# Patient Record
Sex: Male | Born: 1940 | Race: White | Hispanic: No | Marital: Married | Smoking: Former smoker
Health system: Southern US, Community
[De-identification: ages and names within clinical notes are randomized; demographics above are authoritative.]

## PROBLEM LIST (undated history)

## (undated) DIAGNOSIS — N201 Calculus of ureter: Secondary | ICD-10-CM

## (undated) DIAGNOSIS — R351 Nocturia: Secondary | ICD-10-CM

## (undated) DIAGNOSIS — Z87442 Personal history of urinary calculi: Secondary | ICD-10-CM

## (undated) DIAGNOSIS — R350 Frequency of micturition: Secondary | ICD-10-CM

## (undated) DIAGNOSIS — I10 Essential (primary) hypertension: Secondary | ICD-10-CM

## (undated) HISTORY — PX: EXTRACORPOREAL SHOCK WAVE LITHOTRIPSY: SHX1557

---

## 1998-07-10 ENCOUNTER — Emergency Department (HOSPITAL_COMMUNITY): Admission: EM | Admit: 1998-07-10 | Discharge: 1998-07-10 | Payer: Self-pay | Admitting: Emergency Medicine

## 2000-08-09 ENCOUNTER — Emergency Department (HOSPITAL_COMMUNITY): Admission: EM | Admit: 2000-08-09 | Discharge: 2000-08-09 | Payer: Self-pay | Admitting: Emergency Medicine

## 2001-05-16 ENCOUNTER — Encounter: Payer: Self-pay | Admitting: Urology

## 2001-05-16 ENCOUNTER — Encounter: Admission: RE | Admit: 2001-05-16 | Discharge: 2001-05-16 | Payer: Self-pay | Admitting: Urology

## 2002-12-20 ENCOUNTER — Emergency Department (HOSPITAL_COMMUNITY): Admission: EM | Admit: 2002-12-20 | Discharge: 2002-12-20 | Payer: Self-pay

## 2004-07-13 ENCOUNTER — Encounter: Admission: RE | Admit: 2004-07-13 | Discharge: 2004-07-13 | Payer: Self-pay | Admitting: Internal Medicine

## 2005-09-29 IMAGING — CR DG RIBS 2V*R*
3 series · 3 of 3 positions shown · non-contrast
Comparison: none

CLINICAL DATA: Right rib pain. 
 RIGHT RIBS: 
 There is no evidence of fracture, focal lesions, or other significant bone abnormality.

[view not recorded (1 of 3)]
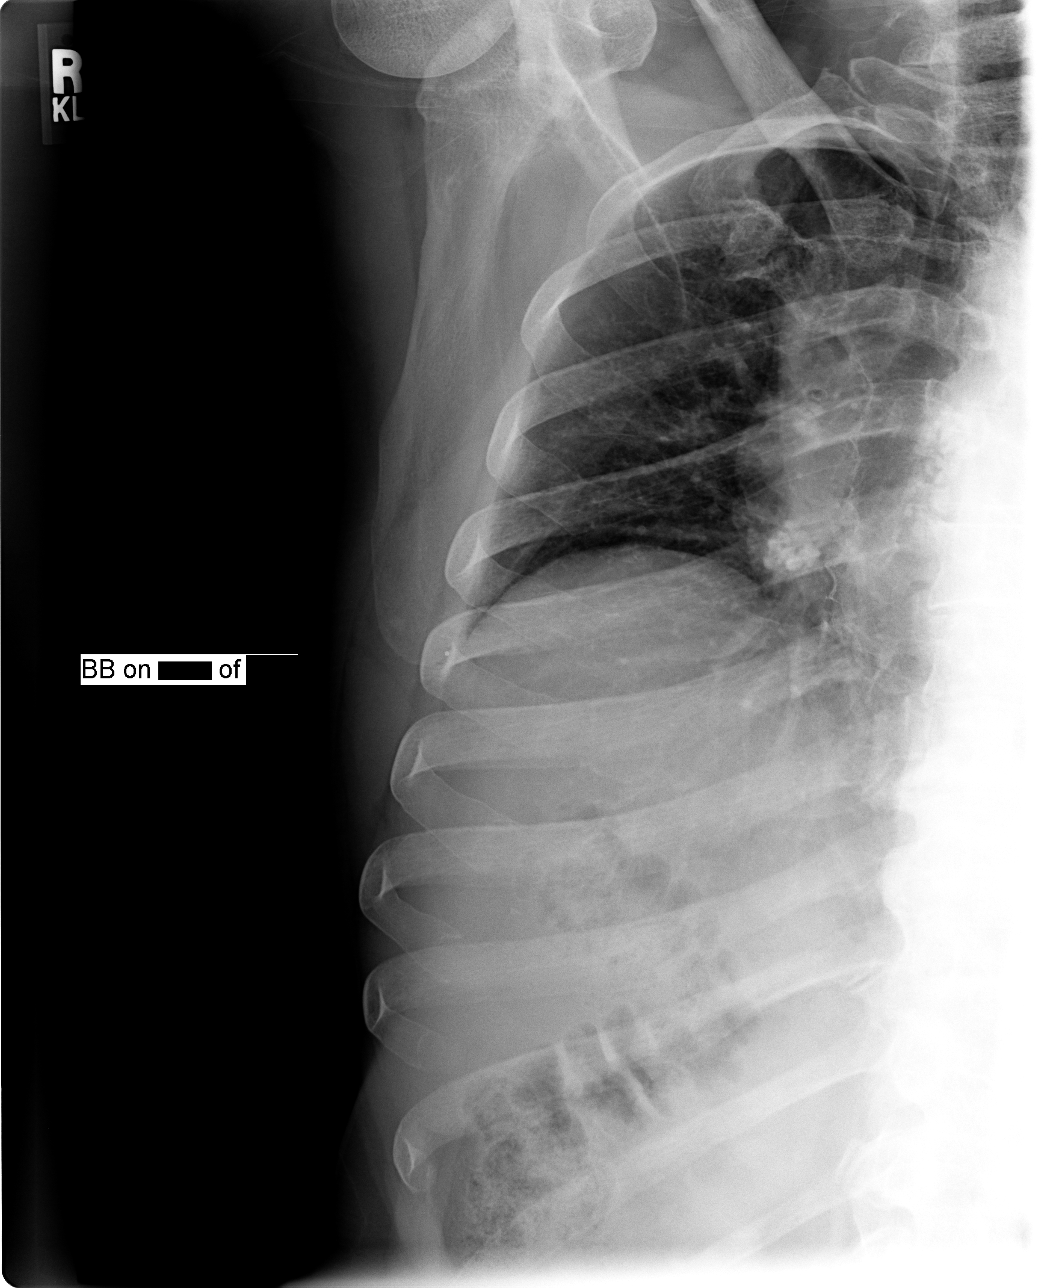

[view not recorded (2 of 3)]
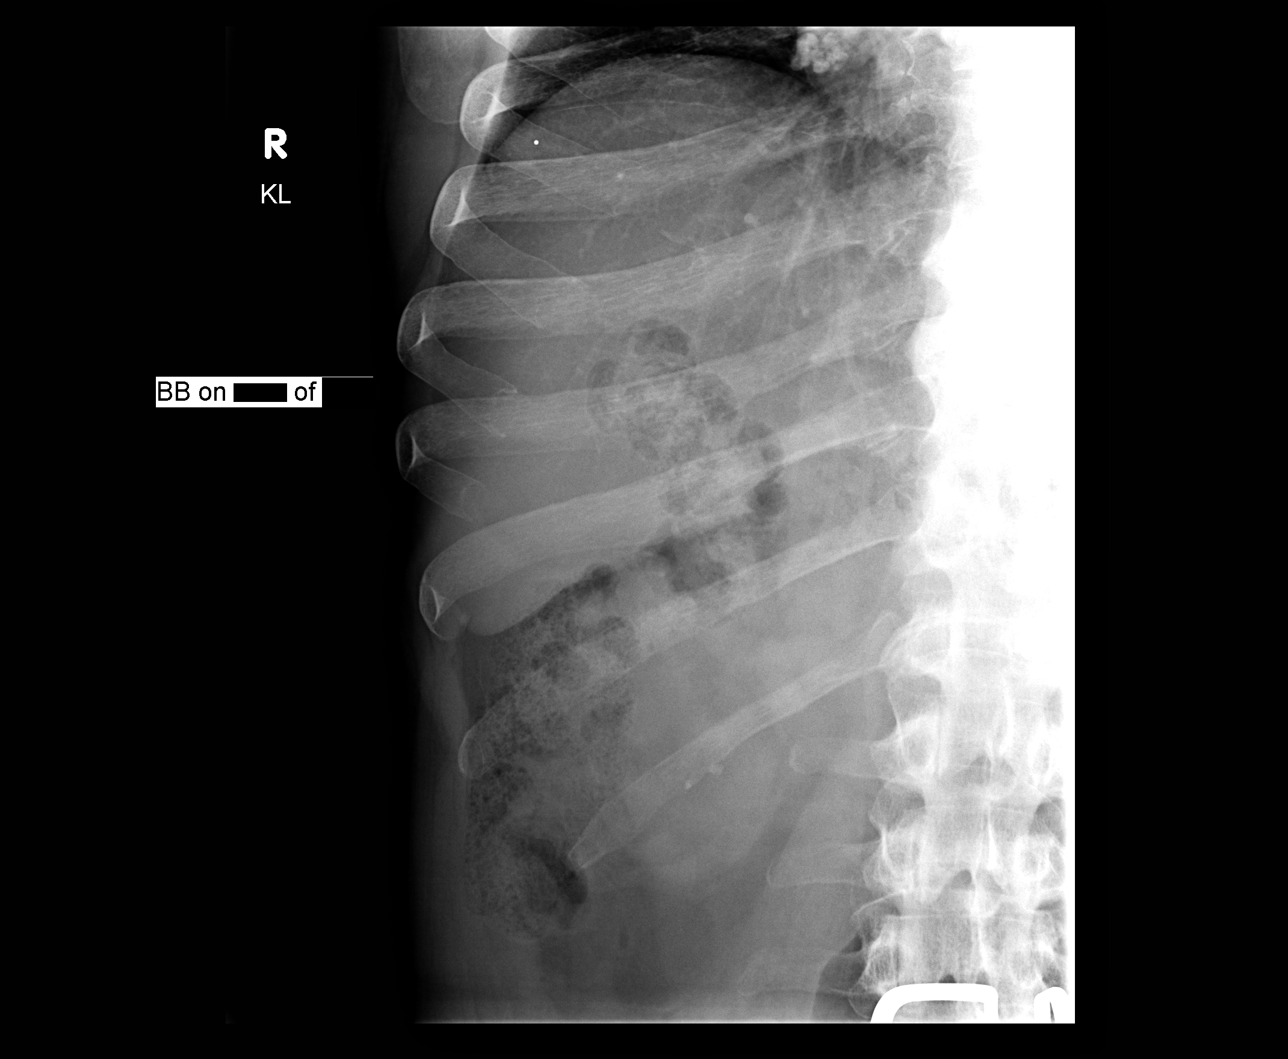

[view not recorded (3 of 3)]
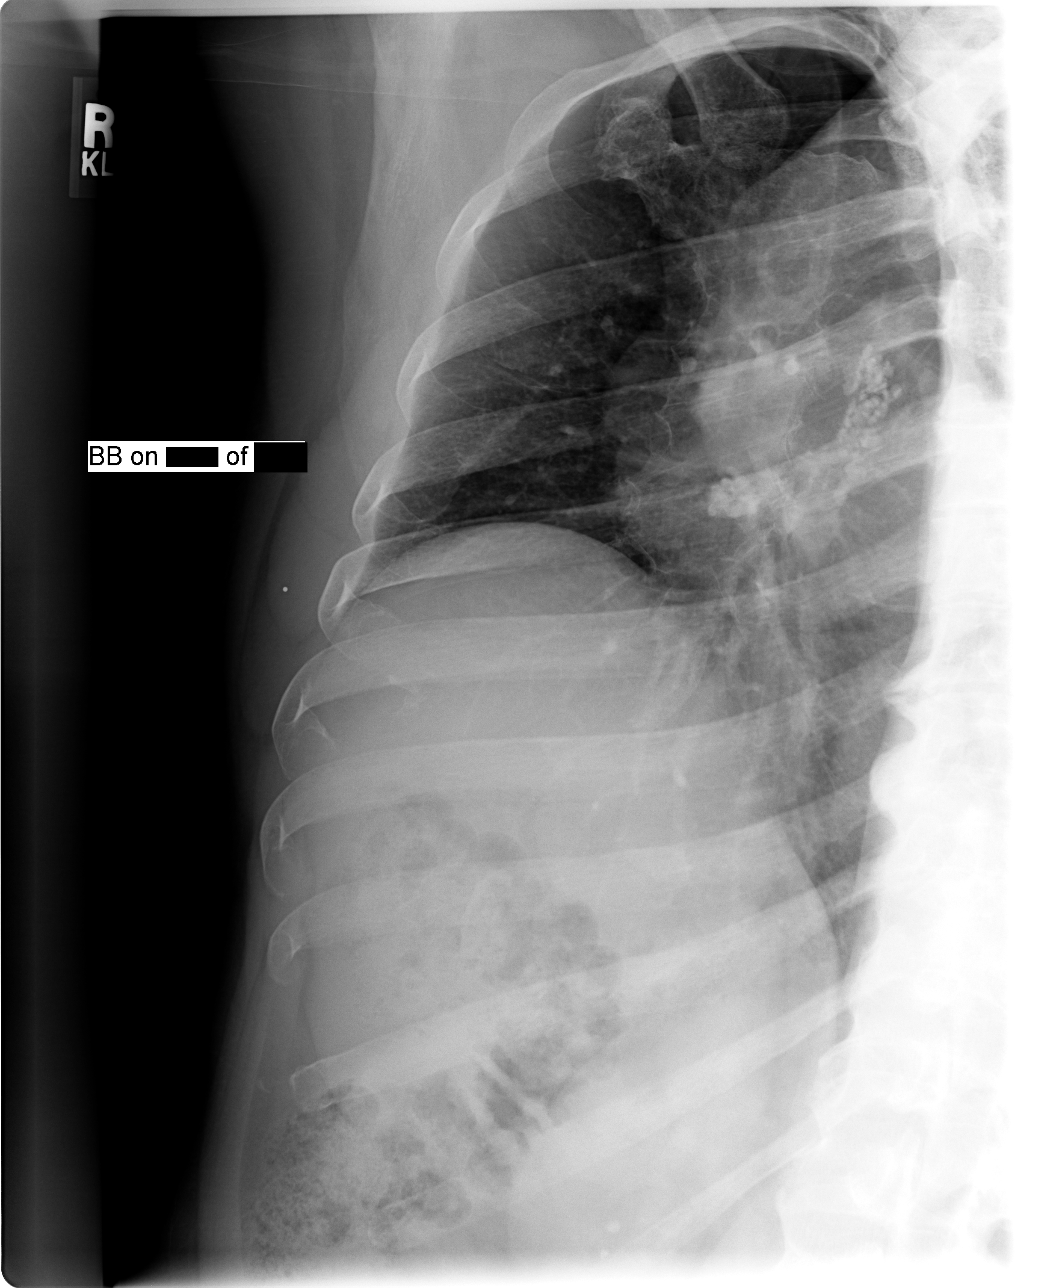

[3 of 3 positions shown; findings below may reference images not displayed]

IMPRESSION: Normal study. 
 TWO VIEW CHEST: 
 There are calcified right hilar and subcarinal lymph nodes consistent with previous granulomatous disease.  The lungs are free of infiltrates.  There is no pneumothorax.  The heart is normal in size.
IMPRESSION: Calcified right hilar and subcarinal lymph nodes consistent with previous granulomatous disease.  No evidence for active chest disease.

## 2009-03-23 ENCOUNTER — Encounter (INDEPENDENT_AMBULATORY_CARE_PROVIDER_SITE_OTHER): Payer: Self-pay | Admitting: *Deleted

## 2009-07-05 ENCOUNTER — Ambulatory Visit (HOSPITAL_BASED_OUTPATIENT_CLINIC_OR_DEPARTMENT_OTHER): Admission: RE | Admit: 2009-07-05 | Discharge: 2009-07-05 | Payer: Self-pay | Admitting: Urology

## 2009-07-07 HISTORY — PX: OTHER SURGICAL HISTORY: SHX169

## 2009-12-09 ENCOUNTER — Telehealth: Payer: Self-pay | Admitting: Internal Medicine

## 2010-11-24 NOTE — Progress Notes (Signed)
Summary: Schedule Colonoscopy  Phone Note Outgoing Call Call back at [facility] Phone [phone]   Call placed by: Harlow Mares CMA Duncan Dull),  December 09, 2009 4:27 PM Call placed to: Patient Summary of Call: Left message on patients machine to call back. patient needs colonoscopy Initial call taken by: Harlow Mares CMA Duncan Dull),  December 09, 2009 4:27 PM  Follow-up for Phone Call        Left message on patients machine to call back.  Follow-up by: Harlow Mares CMA Duncan Dull),  December 15, 2009 12:59 PM

## 2011-01-23 ENCOUNTER — Ambulatory Visit (HOSPITAL_COMMUNITY)
Admission: RE | Admit: 2011-01-23 | Discharge: 2011-01-23 | Disposition: A | Discharge status: Home / Self Care | Payer: Medicare Other | Source: Ambulatory Visit | Attending: Urology | Admitting: Urology

## 2011-01-23 DIAGNOSIS — I10 Essential (primary) hypertension: Secondary | ICD-10-CM | POA: Insufficient documentation

## 2011-01-23 DIAGNOSIS — N200 Calculus of kidney: Secondary | ICD-10-CM | POA: Insufficient documentation

## 2011-01-27 LAB — POCT I-STAT 4, (NA,K, GLUC, HGB,HCT)
Glucose, Bld: 101 mg/dL — ABNORMAL HIGH (ref 70–99)
HCT: 48 % (ref 39.0–52.0)
Hemoglobin: 16.3 g/dL (ref 13.0–17.0)
Potassium: 3.5 mEq/L (ref 3.5–5.1)
Sodium: 143 mEq/L (ref 135–145)

## 2011-12-18 DIAGNOSIS — H2510 Age-related nuclear cataract, unspecified eye: Secondary | ICD-10-CM | POA: Diagnosis not present

## 2012-02-16 DIAGNOSIS — N200 Calculus of kidney: Secondary | ICD-10-CM | POA: Diagnosis not present

## 2012-05-01 DIAGNOSIS — E785 Hyperlipidemia, unspecified: Secondary | ICD-10-CM | POA: Diagnosis not present

## 2012-05-01 DIAGNOSIS — R7301 Impaired fasting glucose: Secondary | ICD-10-CM | POA: Diagnosis not present

## 2012-05-01 DIAGNOSIS — Z125 Encounter for screening for malignant neoplasm of prostate: Secondary | ICD-10-CM | POA: Diagnosis not present

## 2012-05-01 DIAGNOSIS — I10 Essential (primary) hypertension: Secondary | ICD-10-CM | POA: Diagnosis not present

## 2012-05-06 ENCOUNTER — Encounter: Payer: Self-pay | Admitting: Internal Medicine

## 2012-05-09 DIAGNOSIS — Z0000 Encounter for general adult medical examination without abnormal findings: Secondary | ICD-10-CM | POA: Diagnosis not present

## 2012-05-09 DIAGNOSIS — I10 Essential (primary) hypertension: Secondary | ICD-10-CM | POA: Diagnosis not present

## 2012-05-09 DIAGNOSIS — R7301 Impaired fasting glucose: Secondary | ICD-10-CM | POA: Diagnosis not present

## 2012-05-09 DIAGNOSIS — N200 Calculus of kidney: Secondary | ICD-10-CM | POA: Diagnosis not present

## 2012-05-14 DIAGNOSIS — Z1212 Encounter for screening for malignant neoplasm of rectum: Secondary | ICD-10-CM | POA: Diagnosis not present

## 2012-12-18 DIAGNOSIS — H2510 Age-related nuclear cataract, unspecified eye: Secondary | ICD-10-CM | POA: Diagnosis not present

## 2013-02-18 DIAGNOSIS — H6120 Impacted cerumen, unspecified ear: Secondary | ICD-10-CM | POA: Diagnosis not present

## 2013-04-02 DIAGNOSIS — M720 Palmar fascial fibromatosis [Dupuytren]: Secondary | ICD-10-CM | POA: Diagnosis not present

## 2013-04-02 DIAGNOSIS — M6530 Trigger finger, unspecified finger: Secondary | ICD-10-CM | POA: Diagnosis not present

## 2013-05-07 DIAGNOSIS — R7301 Impaired fasting glucose: Secondary | ICD-10-CM | POA: Diagnosis not present

## 2013-05-07 DIAGNOSIS — I10 Essential (primary) hypertension: Secondary | ICD-10-CM | POA: Diagnosis not present

## 2013-05-07 DIAGNOSIS — Z125 Encounter for screening for malignant neoplasm of prostate: Secondary | ICD-10-CM | POA: Diagnosis not present

## 2013-05-07 DIAGNOSIS — E785 Hyperlipidemia, unspecified: Secondary | ICD-10-CM | POA: Diagnosis not present

## 2013-05-14 DIAGNOSIS — Z1331 Encounter for screening for depression: Secondary | ICD-10-CM | POA: Diagnosis not present

## 2013-05-14 DIAGNOSIS — I10 Essential (primary) hypertension: Secondary | ICD-10-CM | POA: Diagnosis not present

## 2013-05-14 DIAGNOSIS — Z125 Encounter for screening for malignant neoplasm of prostate: Secondary | ICD-10-CM | POA: Diagnosis not present

## 2013-05-14 DIAGNOSIS — Z6827 Body mass index (BMI) 27.0-27.9, adult: Secondary | ICD-10-CM | POA: Diagnosis not present

## 2013-05-14 DIAGNOSIS — E785 Hyperlipidemia, unspecified: Secondary | ICD-10-CM | POA: Diagnosis not present

## 2013-05-14 DIAGNOSIS — R7301 Impaired fasting glucose: Secondary | ICD-10-CM | POA: Diagnosis not present

## 2013-05-14 DIAGNOSIS — N200 Calculus of kidney: Secondary | ICD-10-CM | POA: Diagnosis not present

## 2013-05-14 DIAGNOSIS — Z0000 Encounter for general adult medical examination without abnormal findings: Secondary | ICD-10-CM | POA: Diagnosis not present

## 2013-05-15 DIAGNOSIS — Z1212 Encounter for screening for malignant neoplasm of rectum: Secondary | ICD-10-CM | POA: Diagnosis not present

## 2013-08-05 DIAGNOSIS — N200 Calculus of kidney: Secondary | ICD-10-CM | POA: Diagnosis not present

## 2013-10-31 DIAGNOSIS — M6530 Trigger finger, unspecified finger: Secondary | ICD-10-CM | POA: Diagnosis not present

## 2013-11-25 ENCOUNTER — Other Ambulatory Visit: Payer: Self-pay | Admitting: Orthopedic Surgery

## 2013-11-27 DIAGNOSIS — H6120 Impacted cerumen, unspecified ear: Secondary | ICD-10-CM | POA: Diagnosis not present

## 2013-12-01 NOTE — H&P (Signed)
  Marcus Morgan is an 73 y.o. male.   Chief Complaint: c/o chronic STS right ring finger HPI: Marcus Morgan presents for evaluation of a recurrent right ring STS. About 5 years ago he consulted for an early STS and responded to steroid injection. Marcus Morgan retired 2 years ago from [employer]. They are a firm that has moved to [place] and bought out the [employer] in [place].     No past medical history on file.  No past surgical history on file.  No family history on file. Social History:  has no tobacco, alcohol, and drug history on file.  Allergies: Allergies not on file  No prescriptions prior to admission    No results found for this or any previous visit (from the past 48 hour(s)).  No results found.   Pertinent items are noted in HPI.  There were no vitals taken for this visit.  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 He can no longer hyperextend the ring finger MP joint. He has full further flexion but locks his ring finger in flexion. He does not have a PIP flexion contracture of the right ring finger. His pulses and capillary refill are intact. His motor and sensory exam is intact.   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  DASNOIT,Mialani Reicks J 12/01/2013, 4:16 PM   H&P documentation: 12/02/2013  -History and Physical Reviewed  -Patient has been re-examined  -No change in the plan of care  Marcus Sickle, MD

## 2013-12-02 ENCOUNTER — Encounter (HOSPITAL_BASED_OUTPATIENT_CLINIC_OR_DEPARTMENT_OTHER): Payer: Self-pay

## 2013-12-02 ENCOUNTER — Ambulatory Visit (HOSPITAL_BASED_OUTPATIENT_CLINIC_OR_DEPARTMENT_OTHER)
Admission: RE | Admit: 2013-12-02 | Discharge: 2013-12-02 | Disposition: A | Discharge status: Home / Self Care | Payer: Medicare Other | Source: Ambulatory Visit | Attending: Orthopedic Surgery | Admitting: Orthopedic Surgery

## 2013-12-02 ENCOUNTER — Encounter (HOSPITAL_BASED_OUTPATIENT_CLINIC_OR_DEPARTMENT_OTHER)
Admission: RE | Disposition: A | Discharge status: Home / Self Care | Payer: Self-pay | Source: Ambulatory Visit | Attending: Orthopedic Surgery

## 2013-12-02 DIAGNOSIS — M720 Palmar fascial fibromatosis [Dupuytren]: Secondary | ICD-10-CM | POA: Diagnosis not present

## 2013-12-02 DIAGNOSIS — M6530 Trigger finger, unspecified finger: Secondary | ICD-10-CM | POA: Diagnosis not present

## 2013-12-02 DIAGNOSIS — M65849 Other synovitis and tenosynovitis, unspecified hand: Principal | ICD-10-CM

## 2013-12-02 DIAGNOSIS — M65839 Other synovitis and tenosynovitis, unspecified forearm: Secondary | ICD-10-CM | POA: Diagnosis not present

## 2013-12-02 HISTORY — DX: Essential (primary) hypertension: I10

## 2013-12-02 HISTORY — PX: TRIGGER FINGER RELEASE: SHX641

## 2013-12-02 SURGERY — MINOR RELEASE TRIGGER FINGER/A-1 PULLEY
Anesthesia: LOCAL | Site: Finger | Laterality: Right

## 2013-12-02 MED ORDER — TRAMADOL HCL 50 MG PO TABS: 50.0000 mg | ORAL_TABLET | Freq: Four times a day (QID) | ORAL | Status: DC | PRN

## 2013-12-02 MED ORDER — LIDOCAINE HCL 2 % IJ SOLN
INTRAMUSCULAR | Status: DC | PRN
  Administered 2013-12-02: 4 mL

## 2013-12-02 MED ORDER — CHLORHEXIDINE GLUCONATE 4 % EX LIQD: 60.0000 mL | Freq: Once | CUTANEOUS | Status: DC

## 2013-12-02 SURGICAL SUPPLY — 39 items
BANDAGE ADH SHEER 1  50/CT (GAUZE/BANDAGES/DRESSINGS) IMPLANT
BANDAGE COBAN STERILE 2 (GAUZE/BANDAGES/DRESSINGS) ×2 IMPLANT
BLADE SURG 15 STRL LF DISP TIS (BLADE) ×1 IMPLANT
BLADE SURG 15 STRL SS (BLADE) ×1
BNDG ESMARK 4X9 LF (GAUZE/BANDAGES/DRESSINGS) IMPLANT
BRUSH SCRUB EZ PLAIN DRY (MISCELLANEOUS) ×2 IMPLANT
CORDS BIPOLAR (ELECTRODE) IMPLANT
COVER MAYO STAND STRL (DRAPES) ×2 IMPLANT
COVER TABLE BACK 60X90 (DRAPES) IMPLANT
CUFF TOURNIQUET SINGLE 18IN (TOURNIQUET CUFF) ×2 IMPLANT
DECANTER SPIKE VIAL GLASS SM (MISCELLANEOUS) IMPLANT
DRAPE SURG 17X23 STRL (DRAPES) ×2 IMPLANT
GLOVE BIOGEL M STRL SZ7.5 (GLOVE) IMPLANT
GLOVE BIOGEL PI IND STRL 7.0 (GLOVE) ×2 IMPLANT
GLOVE BIOGEL PI IND STRL 8 (GLOVE) ×1 IMPLANT
GLOVE BIOGEL PI INDICATOR 7.0 (GLOVE) ×2
GLOVE BIOGEL PI INDICATOR 8 (GLOVE) ×1
GLOVE ECLIPSE 6.5 STRL STRAW (GLOVE) ×2 IMPLANT
GLOVE ORTHO TXT STRL SZ7.5 (GLOVE) ×2 IMPLANT
GLOVE SURG SS PI 8.0 STRL IVOR (GLOVE) ×2 IMPLANT
GOWN STRL REUS W/ TWL LRG LVL3 (GOWN DISPOSABLE) ×1 IMPLANT
GOWN STRL REUS W/ TWL XL LVL3 (GOWN DISPOSABLE) ×2 IMPLANT
GOWN STRL REUS W/TWL LRG LVL3 (GOWN DISPOSABLE) ×1
GOWN STRL REUS W/TWL XL LVL3 (GOWN DISPOSABLE) ×2
NEEDLE 27GAX1X1/2 (NEEDLE) ×2 IMPLANT
PACK BASIN DAY SURGERY FS (CUSTOM PROCEDURE TRAY) ×2 IMPLANT
PADDING CAST ABS 4INX4YD NS (CAST SUPPLIES) ×1
PADDING CAST ABS COTTON 4X4 ST (CAST SUPPLIES) ×1 IMPLANT
SPONGE GAUZE 4X4 12PLY (GAUZE/BANDAGES/DRESSINGS) ×2 IMPLANT
SPONGE GAUZE 4X4 12PLY STER LF (GAUZE/BANDAGES/DRESSINGS) IMPLANT
STOCKINETTE 4X48 STRL (DRAPES) ×2 IMPLANT
STRIP CLOSURE SKIN 1/2X4 (GAUZE/BANDAGES/DRESSINGS) ×2 IMPLANT
SUT PROLENE 3 0 PS 2 (SUTURE) ×2 IMPLANT
SUT PROLENE 4 0 P 3 18 (SUTURE) IMPLANT
SYR 3ML 23GX1 SAFETY (SYRINGE) IMPLANT
SYR CONTROL 10ML LL (SYRINGE) ×2 IMPLANT
TOWEL OR 17X24 6PK STRL BLUE (TOWEL DISPOSABLE) ×4 IMPLANT
TRAY DSU PREP LF (CUSTOM PROCEDURE TRAY) ×2 IMPLANT
UNDERPAD 30X30 INCONTINENT (UNDERPADS AND DIAPERS) ×2 IMPLANT

## 2013-12-02 NOTE — Discharge Instructions (Signed)

## 2013-12-02 NOTE — Brief Op Note (Signed)
12/02/2013  12:17 PM  PATIENT:  Marcus Morgan  73 y.o. male  PRE-OPERATIVE DIAGNOSIS:  STENOSING TENOSYNOVITIS RIGHT RING FINGER  POST-OPERATIVE DIAGNOSIS:  STENOSING TENOSYNOVITIS RIGHT RING FINGER  PROCEDURE:  Procedure(s): MINOR RELEASE A-1 PULLEY RIGHT RING FINGER AND INCIDENTAL DEPUYTREN'S EXCISION (Right)  SURGEON:  Surgeon(s) and Role:    * Cammie Sickle., MD - Primary  PHYSICIAN ASSISTANT:   ASSISTANTS: nurse  ANESTHESIA:   local  EBL:     BLOOD ADMINISTERED:none  DRAINS: none   LOCAL MEDICATIONS USED:  XYLOCAINE   SPECIMEN:  No Specimen  DISPOSITION OF SPECIMEN:  N/A  COUNTS:  YES  TOURNIQUET:   Total Tourniquet Time Documented: Upper Arm (Right) - 9 minutes Total: Upper Arm (Right) - 9 minutes   DICTATION: .Other Dictation: Dictation Number [id number]  PLAN OF CARE: Discharge to home after PACU  PATIENT DISPOSITION:  PACU - hemodynamically stable.   Delay start of Pharmacological VTE agent (>24hrs) due to surgical blood loss or risk of bleeding: not applicable

## 2013-12-02 NOTE — Op Note (Signed)
[id number] 

## 2013-12-03 NOTE — Op Note (Signed)
Marcus Morgan, Marcus NO.:  [id number]  Marcus RECORD NO.:  [id number]  LOCATION:                                 FACILITY:  PHYSICIAN:  Marcus Morgan, M.D. 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  OPERATION: 1. Limited resection of palmar fascia. 2. Release of right ring finger A1 pulley.  OPERATING SURGEON:  Marcus Mighty. Raylan Troiani, MD  ASSISTANT:  Nurse.  ANESTHESIA:  2% lidocaine, this was performed as a local anesthesia minor operating room procedure.  INDICATIONS:  Marcus Morgan is a 73 year old gentleman referred through the courtesy of Dr. Domenick Morgan, for management of chronic trigger finger.  Clinical examination revealed Dupuytren's palmar fibromatosis, which can aggravate trigger finger onset.  Marcus Morgan was advised to undergo limited resection of his palmar fascia and release of the A1 pulley of the ring finger.  Surgeon aftercare described in detail.  He understands Dupuytren's palmar fibromatosis predicament will progress over time.  This is genetically driven pathology that will appear in other areas in his hand and may require further intervention at a later date.  In the holding area, Marcus Morgan was carefully examined.  He was beginning to develop early triggering of his long finger.  We will address this with a steroid injection at the office.  After detailed informed consent, he was brought to the operating at this time as a minor operating room procedure.  PROCEDURE IN DETAIL:  Marcus Morgan was brought  to room 2 of the Marcus Morgan, placed in supine position on the operating table.  Following detailed informed consent, Betadine prep 4 mL of 2% lidocaine were infiltrated at metacarpal head level to obtain a digital block and into the flexor sheath of the right ring finger. The hand and arm were then prepped with Betadine soap and solution, sterilely draped.  A pneumatic tourniquet was applied to the proximal brachium.  Due to mild systolic hypertension, the tourniquet was set at 250 mmHg.  Procedure commenced with routine surgical time-out. Following exsanguination of the arm by direct compression, the arterial tourniquet was inflated to 250 mmHg.  After testing for adequate anesthesia, we performed a 2-cm oblique incision directly over the A1 pulley and palmar fascia.  The fatty dermis was undermined separating the pathologic fascia from the deep side of the dermis followed by resection of 2.5 cm length of the pretendinous fibers to the ring finger.  The flexor sheath was identified.  There was a band of fascia proximal to the A1 pulley that was released.  We then identified the A1 pulley, placed Ragnell retractors to protect the neurovascular structures and release the A1 pulley, it's full length.  Thereafter, Marcus Morgan demonstrated full active range of motion of his finger in flexion and extension.  The wound was inspected for bleeding  points and repaired with intradermal 4-0 Prolene suture.  Steri-Strips applied.  The tourniquet was released.  Pressure was held for 2 minutes due to the fact that he is on aspirin.  We then placed a compressive dressing with sterile gauze and Ace wrap.  There were no apparent complications.  For aftercare, he was provided a prescription for tramadol 50 mg 1-2 tablets p.o. q.4-6 hours p.r.n. pain, 20 tabs without refill.     Marcus Mighty Marcus Morgan, M.D.     RVS/MEDQ  D:  12/02/2013  T:  12/03/2013  Job:  [id number]  cc:   Marcus Morgan, M.D.

## 2013-12-04 ENCOUNTER — Encounter (HOSPITAL_BASED_OUTPATIENT_CLINIC_OR_DEPARTMENT_OTHER): Payer: Self-pay | Admitting: Orthopedic Surgery

## 2014-01-08 DIAGNOSIS — H2510 Age-related nuclear cataract, unspecified eye: Secondary | ICD-10-CM | POA: Diagnosis not present

## 2014-01-08 DIAGNOSIS — H40049 Steroid responder, unspecified eye: Secondary | ICD-10-CM | POA: Diagnosis not present

## 2014-02-18 DIAGNOSIS — H43819 Vitreous degeneration, unspecified eye: Secondary | ICD-10-CM | POA: Diagnosis not present

## 2014-02-26 DIAGNOSIS — H43399 Other vitreous opacities, unspecified eye: Secondary | ICD-10-CM | POA: Diagnosis not present

## 2014-03-23 DIAGNOSIS — H43399 Other vitreous opacities, unspecified eye: Secondary | ICD-10-CM | POA: Diagnosis not present

## 2014-04-06 DIAGNOSIS — S20219A Contusion of unspecified front wall of thorax, initial encounter: Secondary | ICD-10-CM | POA: Diagnosis not present

## 2014-04-06 DIAGNOSIS — M546 Pain in thoracic spine: Secondary | ICD-10-CM | POA: Diagnosis not present

## 2014-04-06 DIAGNOSIS — Y92009 Unspecified place in unspecified non-institutional (private) residence as the place of occurrence of the external cause: Secondary | ICD-10-CM | POA: Diagnosis not present

## 2014-04-15 DIAGNOSIS — M549 Dorsalgia, unspecified: Secondary | ICD-10-CM | POA: Diagnosis not present

## 2014-04-15 DIAGNOSIS — S2341XA Sprain of ribs, initial encounter: Secondary | ICD-10-CM | POA: Diagnosis not present

## 2014-04-15 DIAGNOSIS — Y92009 Unspecified place in unspecified non-institutional (private) residence as the place of occurrence of the external cause: Secondary | ICD-10-CM | POA: Diagnosis not present

## 2014-04-20 DIAGNOSIS — H2510 Age-related nuclear cataract, unspecified eye: Secondary | ICD-10-CM | POA: Diagnosis not present

## 2014-06-03 DIAGNOSIS — I10 Essential (primary) hypertension: Secondary | ICD-10-CM | POA: Diagnosis not present

## 2014-06-03 DIAGNOSIS — E785 Hyperlipidemia, unspecified: Secondary | ICD-10-CM | POA: Diagnosis not present

## 2014-06-03 DIAGNOSIS — Z125 Encounter for screening for malignant neoplasm of prostate: Secondary | ICD-10-CM | POA: Diagnosis not present

## 2014-06-03 DIAGNOSIS — R7301 Impaired fasting glucose: Secondary | ICD-10-CM | POA: Diagnosis not present

## 2014-06-10 ENCOUNTER — Encounter: Payer: Self-pay | Admitting: Internal Medicine

## 2014-06-10 DIAGNOSIS — Z0000 Encounter for general adult medical examination without abnormal findings: Secondary | ICD-10-CM | POA: Diagnosis not present

## 2014-06-10 DIAGNOSIS — R7301 Impaired fasting glucose: Secondary | ICD-10-CM | POA: Diagnosis not present

## 2014-06-10 DIAGNOSIS — E785 Hyperlipidemia, unspecified: Secondary | ICD-10-CM | POA: Diagnosis not present

## 2014-06-10 DIAGNOSIS — Z125 Encounter for screening for malignant neoplasm of prostate: Secondary | ICD-10-CM | POA: Diagnosis not present

## 2014-06-10 DIAGNOSIS — Z6827 Body mass index (BMI) 27.0-27.9, adult: Secondary | ICD-10-CM | POA: Diagnosis not present

## 2014-06-10 DIAGNOSIS — Z1331 Encounter for screening for depression: Secondary | ICD-10-CM | POA: Diagnosis not present

## 2014-06-10 DIAGNOSIS — N200 Calculus of kidney: Secondary | ICD-10-CM | POA: Diagnosis not present

## 2014-06-10 DIAGNOSIS — I10 Essential (primary) hypertension: Secondary | ICD-10-CM | POA: Diagnosis not present

## 2014-06-11 DIAGNOSIS — Z1212 Encounter for screening for malignant neoplasm of rectum: Secondary | ICD-10-CM | POA: Diagnosis not present

## 2014-08-03 DIAGNOSIS — H10011 Acute follicular conjunctivitis, right eye: Secondary | ICD-10-CM | POA: Diagnosis not present

## 2014-08-10 DIAGNOSIS — N200 Calculus of kidney: Secondary | ICD-10-CM | POA: Diagnosis not present

## 2014-08-18 ENCOUNTER — Encounter: Payer: Medicare Other | Admitting: Internal Medicine

## 2015-06-07 DIAGNOSIS — R7302 Impaired glucose tolerance (oral): Secondary | ICD-10-CM | POA: Diagnosis not present

## 2015-06-07 DIAGNOSIS — I10 Essential (primary) hypertension: Secondary | ICD-10-CM | POA: Diagnosis not present

## 2015-06-07 DIAGNOSIS — Z125 Encounter for screening for malignant neoplasm of prostate: Secondary | ICD-10-CM | POA: Diagnosis not present

## 2015-06-07 DIAGNOSIS — E785 Hyperlipidemia, unspecified: Secondary | ICD-10-CM | POA: Diagnosis not present

## 2015-06-14 DIAGNOSIS — R7302 Impaired glucose tolerance (oral): Secondary | ICD-10-CM | POA: Diagnosis not present

## 2015-06-14 DIAGNOSIS — I10 Essential (primary) hypertension: Secondary | ICD-10-CM | POA: Diagnosis not present

## 2015-06-14 DIAGNOSIS — Z1389 Encounter for screening for other disorder: Secondary | ICD-10-CM | POA: Diagnosis not present

## 2015-06-14 DIAGNOSIS — Z6827 Body mass index (BMI) 27.0-27.9, adult: Secondary | ICD-10-CM | POA: Diagnosis not present

## 2015-06-14 DIAGNOSIS — M720 Palmar fascial fibromatosis [Dupuytren]: Secondary | ICD-10-CM | POA: Diagnosis not present

## 2015-06-14 DIAGNOSIS — M65332 Trigger finger, left middle finger: Secondary | ICD-10-CM | POA: Diagnosis not present

## 2015-06-14 DIAGNOSIS — N200 Calculus of kidney: Secondary | ICD-10-CM | POA: Diagnosis not present

## 2015-06-14 DIAGNOSIS — Z0000 Encounter for general adult medical examination without abnormal findings: Secondary | ICD-10-CM | POA: Diagnosis not present

## 2015-06-14 DIAGNOSIS — E785 Hyperlipidemia, unspecified: Secondary | ICD-10-CM | POA: Diagnosis not present

## 2015-06-21 DIAGNOSIS — Z1212 Encounter for screening for malignant neoplasm of rectum: Secondary | ICD-10-CM | POA: Diagnosis not present

## 2015-07-12 DIAGNOSIS — M720 Palmar fascial fibromatosis [Dupuytren]: Secondary | ICD-10-CM | POA: Diagnosis not present

## 2015-07-12 DIAGNOSIS — M65332 Trigger finger, left middle finger: Secondary | ICD-10-CM | POA: Diagnosis not present

## 2015-07-19 ENCOUNTER — Encounter: Payer: Self-pay | Admitting: Internal Medicine

## 2015-08-09 DIAGNOSIS — R351 Nocturia: Secondary | ICD-10-CM | POA: Diagnosis not present

## 2015-08-09 DIAGNOSIS — N401 Enlarged prostate with lower urinary tract symptoms: Secondary | ICD-10-CM | POA: Diagnosis not present

## 2015-08-09 DIAGNOSIS — N138 Other obstructive and reflux uropathy: Secondary | ICD-10-CM | POA: Diagnosis not present

## 2015-08-09 DIAGNOSIS — N200 Calculus of kidney: Secondary | ICD-10-CM | POA: Diagnosis not present

## 2015-08-12 DIAGNOSIS — H2513 Age-related nuclear cataract, bilateral: Secondary | ICD-10-CM | POA: Diagnosis not present

## 2015-08-12 DIAGNOSIS — H524 Presbyopia: Secondary | ICD-10-CM | POA: Diagnosis not present

## 2015-10-06 ENCOUNTER — Encounter: Payer: Self-pay | Admitting: Internal Medicine

## 2015-10-08 DIAGNOSIS — M65332 Trigger finger, left middle finger: Secondary | ICD-10-CM | POA: Diagnosis not present

## 2015-10-11 DIAGNOSIS — Z6828 Body mass index (BMI) 28.0-28.9, adult: Secondary | ICD-10-CM | POA: Diagnosis not present

## 2015-10-11 DIAGNOSIS — E663 Overweight: Secondary | ICD-10-CM | POA: Diagnosis not present

## 2015-10-11 DIAGNOSIS — I10 Essential (primary) hypertension: Secondary | ICD-10-CM | POA: Diagnosis not present

## 2015-10-11 DIAGNOSIS — E785 Hyperlipidemia, unspecified: Secondary | ICD-10-CM | POA: Diagnosis not present

## 2015-10-13 DIAGNOSIS — D1801 Hemangioma of skin and subcutaneous tissue: Secondary | ICD-10-CM | POA: Diagnosis not present

## 2015-10-13 DIAGNOSIS — L821 Other seborrheic keratosis: Secondary | ICD-10-CM | POA: Diagnosis not present

## 2015-10-13 DIAGNOSIS — D225 Melanocytic nevi of trunk: Secondary | ICD-10-CM | POA: Diagnosis not present

## 2015-10-13 DIAGNOSIS — L820 Inflamed seborrheic keratosis: Secondary | ICD-10-CM | POA: Diagnosis not present

## 2015-12-08 DIAGNOSIS — M65331 Trigger finger, right middle finger: Secondary | ICD-10-CM | POA: Diagnosis not present

## 2016-01-13 DIAGNOSIS — H10501 Unspecified blepharoconjunctivitis, right eye: Secondary | ICD-10-CM | POA: Diagnosis not present

## 2016-06-15 DIAGNOSIS — H5213 Myopia, bilateral: Secondary | ICD-10-CM | POA: Diagnosis not present

## 2016-06-15 DIAGNOSIS — H524 Presbyopia: Secondary | ICD-10-CM | POA: Diagnosis not present

## 2016-06-15 DIAGNOSIS — H25813 Combined forms of age-related cataract, bilateral: Secondary | ICD-10-CM | POA: Diagnosis not present

## 2016-06-27 DIAGNOSIS — N200 Calculus of kidney: Secondary | ICD-10-CM | POA: Diagnosis not present

## 2016-06-27 DIAGNOSIS — I10 Essential (primary) hypertension: Secondary | ICD-10-CM | POA: Diagnosis not present

## 2016-07-10 DIAGNOSIS — R319 Hematuria, unspecified: Secondary | ICD-10-CM | POA: Diagnosis not present

## 2016-07-10 DIAGNOSIS — N390 Urinary tract infection, site not specified: Secondary | ICD-10-CM | POA: Diagnosis not present

## 2016-07-10 DIAGNOSIS — R399 Unspecified symptoms and signs involving the genitourinary system: Secondary | ICD-10-CM | POA: Diagnosis not present

## 2016-07-25 DIAGNOSIS — N401 Enlarged prostate with lower urinary tract symptoms: Secondary | ICD-10-CM | POA: Diagnosis not present

## 2016-07-25 DIAGNOSIS — R351 Nocturia: Secondary | ICD-10-CM | POA: Diagnosis not present

## 2016-07-28 DIAGNOSIS — Z23 Encounter for immunization: Secondary | ICD-10-CM | POA: Diagnosis not present

## 2016-08-04 DIAGNOSIS — R3915 Urgency of urination: Secondary | ICD-10-CM | POA: Diagnosis not present

## 2016-08-04 DIAGNOSIS — N132 Hydronephrosis with renal and ureteral calculous obstruction: Secondary | ICD-10-CM | POA: Diagnosis not present

## 2016-08-04 DIAGNOSIS — N200 Calculus of kidney: Secondary | ICD-10-CM | POA: Diagnosis not present

## 2016-08-04 DIAGNOSIS — R310 Gross hematuria: Secondary | ICD-10-CM | POA: Diagnosis not present

## 2016-08-04 DIAGNOSIS — R351 Nocturia: Secondary | ICD-10-CM | POA: Diagnosis not present

## 2016-08-14 DIAGNOSIS — N401 Enlarged prostate with lower urinary tract symptoms: Secondary | ICD-10-CM | POA: Diagnosis not present

## 2016-08-14 DIAGNOSIS — R351 Nocturia: Secondary | ICD-10-CM | POA: Diagnosis not present

## 2016-08-14 DIAGNOSIS — N201 Calculus of ureter: Secondary | ICD-10-CM | POA: Diagnosis not present

## 2016-08-16 ENCOUNTER — Other Ambulatory Visit: Payer: Self-pay | Admitting: Urology

## 2016-08-18 ENCOUNTER — Encounter (HOSPITAL_BASED_OUTPATIENT_CLINIC_OR_DEPARTMENT_OTHER): Payer: Self-pay | Admitting: *Deleted

## 2016-08-18 NOTE — Progress Notes (Signed)
NPO AFTER MN.  ARRIVE AT RN:[id number].  NEEDS ISTAT AND EKG.

## 2016-08-23 ENCOUNTER — Encounter (HOSPITAL_BASED_OUTPATIENT_CLINIC_OR_DEPARTMENT_OTHER): Payer: Self-pay

## 2016-08-23 ENCOUNTER — Ambulatory Visit (HOSPITAL_BASED_OUTPATIENT_CLINIC_OR_DEPARTMENT_OTHER)
Admission: RE | Admit: 2016-08-23 | Discharge: 2016-08-23 | Disposition: A | Discharge status: Home / Self Care | Payer: Medicare Other | Source: Ambulatory Visit | Attending: Urology | Admitting: Urology

## 2016-08-23 ENCOUNTER — Other Ambulatory Visit: Payer: Self-pay

## 2016-08-23 ENCOUNTER — Ambulatory Visit (HOSPITAL_BASED_OUTPATIENT_CLINIC_OR_DEPARTMENT_OTHER): Payer: Medicare Other | Admitting: Anesthesiology

## 2016-08-23 ENCOUNTER — Encounter (HOSPITAL_BASED_OUTPATIENT_CLINIC_OR_DEPARTMENT_OTHER)
Admission: RE | Disposition: A | Discharge status: Home / Self Care | Payer: Self-pay | Source: Ambulatory Visit | Attending: Urology

## 2016-08-23 DIAGNOSIS — R351 Nocturia: Secondary | ICD-10-CM | POA: Insufficient documentation

## 2016-08-23 DIAGNOSIS — N210 Calculus in bladder: Secondary | ICD-10-CM | POA: Insufficient documentation

## 2016-08-23 DIAGNOSIS — R3915 Urgency of urination: Secondary | ICD-10-CM | POA: Insufficient documentation

## 2016-08-23 DIAGNOSIS — N401 Enlarged prostate with lower urinary tract symptoms: Secondary | ICD-10-CM | POA: Diagnosis not present

## 2016-08-23 DIAGNOSIS — Z79899 Other long term (current) drug therapy: Secondary | ICD-10-CM | POA: Insufficient documentation

## 2016-08-23 DIAGNOSIS — Z87891 Personal history of nicotine dependence: Secondary | ICD-10-CM | POA: Diagnosis not present

## 2016-08-23 DIAGNOSIS — I10 Essential (primary) hypertension: Secondary | ICD-10-CM | POA: Insufficient documentation

## 2016-08-23 DIAGNOSIS — Z87442 Personal history of urinary calculi: Secondary | ICD-10-CM | POA: Diagnosis not present

## 2016-08-23 DIAGNOSIS — D494 Neoplasm of unspecified behavior of bladder: Secondary | ICD-10-CM | POA: Diagnosis not present

## 2016-08-23 DIAGNOSIS — N200 Calculus of kidney: Secondary | ICD-10-CM | POA: Diagnosis not present

## 2016-08-23 HISTORY — PX: CYSTOSCOPY/URETEROSCOPY/HOLMIUM LASER/STENT PLACEMENT: SHX6546

## 2016-08-23 HISTORY — DX: Personal history of urinary calculi: Z87.442

## 2016-08-23 HISTORY — DX: Nocturia: R35.1

## 2016-08-23 HISTORY — DX: Frequency of micturition: R35.0

## 2016-08-23 HISTORY — DX: Calculus of ureter: N20.1

## 2016-08-23 HISTORY — PX: HOLMIUM LASER APPLICATION: SHX5852

## 2016-08-23 LAB — POCT I-STAT 4, (NA,K, GLUC, HGB,HCT)
GLUCOSE: 121 mg/dL — AB (ref 65–99)
HCT: 43 % (ref 39.0–52.0)
Hemoglobin: 14.6 g/dL (ref 13.0–17.0)
Potassium: 3.5 mmol/L (ref 3.5–5.1)
SODIUM: 145 mmol/L (ref 135–145)

## 2016-08-23 SURGERY — CYSTOSCOPY/URETEROSCOPY/HOLMIUM LASER/STENT PLACEMENT
Anesthesia: General | Site: Renal

## 2016-08-23 MED ORDER — KETOROLAC TROMETHAMINE 30 MG/ML IJ SOLN
INTRAMUSCULAR | Status: AC
  Filled 2016-08-23: qty 2

## 2016-08-23 MED ORDER — EPHEDRINE SULFATE-NACL 50-0.9 MG/10ML-% IV SOSY
PREFILLED_SYRINGE | INTRAVENOUS | Status: DC | PRN
  Administered 2016-08-23: 10 mg via INTRAVENOUS

## 2016-08-23 MED ORDER — PROPOFOL 10 MG/ML IV BOLUS
INTRAVENOUS | Status: AC
  Filled 2016-08-23: qty 20

## 2016-08-23 MED ORDER — ONDANSETRON HCL 4 MG/2ML IJ SOLN
INTRAMUSCULAR | Status: AC
  Filled 2016-08-23: qty 2

## 2016-08-23 MED ORDER — LIDOCAINE 2% (20 MG/ML) 5 ML SYRINGE
INTRAMUSCULAR | Status: DC | PRN
  Administered 2016-08-23: 50 mg via INTRAVENOUS

## 2016-08-23 MED ORDER — LACTATED RINGERS IV SOLN
INTRAVENOUS | Status: DC
  Administered 2016-08-23 (×2): via INTRAVENOUS
  Filled 2016-08-23: qty 1000

## 2016-08-23 MED ORDER — FENTANYL CITRATE (PF) 100 MCG/2ML IJ SOLN
INTRAMUSCULAR | Status: AC
  Filled 2016-08-23: qty 2

## 2016-08-23 MED ORDER — CEFAZOLIN SODIUM-DEXTROSE 2-4 GM/100ML-% IV SOLN
2.0000 g | INTRAVENOUS | Status: AC
  Administered 2016-08-23: 2 g via INTRAVENOUS
  Filled 2016-08-23: qty 100

## 2016-08-23 MED ORDER — LIDOCAINE 2% (20 MG/ML) 5 ML SYRINGE
INTRAMUSCULAR | Status: AC
  Filled 2016-08-23: qty 5

## 2016-08-23 MED ORDER — EPHEDRINE 5 MG/ML INJ
INTRAVENOUS | Status: AC
  Filled 2016-08-23: qty 10

## 2016-08-23 MED ORDER — FENTANYL CITRATE (PF) 100 MCG/2ML IJ SOLN
INTRAMUSCULAR | Status: DC | PRN
  Administered 2016-08-23: 25 ug via INTRAVENOUS

## 2016-08-23 MED ORDER — CEFAZOLIN SODIUM-DEXTROSE 2-4 GM/100ML-% IV SOLN
INTRAVENOUS | Status: AC
  Filled 2016-08-23: qty 100

## 2016-08-23 MED ORDER — DEXAMETHASONE SODIUM PHOSPHATE 10 MG/ML IJ SOLN
INTRAMUSCULAR | Status: AC
  Filled 2016-08-23: qty 1

## 2016-08-23 MED ORDER — PROPOFOL 10 MG/ML IV BOLUS
INTRAVENOUS | Status: DC | PRN
  Administered 2016-08-23: 40 mg via INTRAVENOUS
  Administered 2016-08-23: 160 mg via INTRAVENOUS
  Administered 2016-08-23: 40 mg via INTRAVENOUS

## 2016-08-23 MED ORDER — SODIUM CHLORIDE 0.9 % IR SOLN
Status: DC | PRN
Start: 2016-08-23 — End: 2016-08-23
  Administered 2016-08-23: 4000 mL

## 2016-08-23 MED ORDER — ONDANSETRON HCL 4 MG/2ML IJ SOLN
INTRAMUSCULAR | Status: DC | PRN
  Administered 2016-08-23: 4 mg via INTRAVENOUS

## 2016-08-23 MED ORDER — PHENAZOPYRIDINE HCL 200 MG PO TABS: 200.0000 mg | ORAL_TABLET | Freq: Three times a day (TID) | ORAL | 0 refills | Status: AC | PRN

## 2016-08-23 MED ORDER — DEXAMETHASONE SODIUM PHOSPHATE 4 MG/ML IJ SOLN
INTRAMUSCULAR | Status: DC | PRN
  Administered 2016-08-23: 10 mg via INTRAVENOUS

## 2016-08-23 SURGICAL SUPPLY — 35 items
BAG DRAIN URO-CYSTO SKYTR STRL (DRAIN) ×4 IMPLANT
BASKET LASER NITINOL 1.9FR (BASKET) IMPLANT
BASKET STNLS GEMINI 4WIRE 3FR (BASKET) IMPLANT
BASKET ZERO TIP NITINOL 2.4FR (BASKET) IMPLANT
BENZOIN TINCTURE PRP APPL 2/3 (GAUZE/BANDAGES/DRESSINGS) IMPLANT
CATH INTERMIT  6FR 70CM (CATHETERS) IMPLANT
CATH URET 5FR 28IN CONE TIP (BALLOONS)
CATH URET 5FR 28IN OPEN ENDED (CATHETERS) IMPLANT
CATH URET 5FR 70CM CONE TIP (BALLOONS) IMPLANT
CLOTH BEACON ORANGE TIMEOUT ST (SAFETY) ×4 IMPLANT
DRSG TEGADERM 2-3/8X2-3/4 SM (GAUZE/BANDAGES/DRESSINGS) IMPLANT
FIBER LASER FLEXIVA 365 (UROLOGICAL SUPPLIES) IMPLANT
FIBER LASER FLEXIVA 550 (UROLOGICAL SUPPLIES) ×4 IMPLANT
FIBER LASER TRAC TIP (UROLOGICAL SUPPLIES) IMPLANT
GLOVE BIO SURGEON STRL SZ7.5 (GLOVE) ×4 IMPLANT
GLOVE BIOGEL PI IND STRL 7.5 (GLOVE) ×4 IMPLANT
GLOVE BIOGEL PI INDICATOR 7.5 (GLOVE) ×4
GOWN STRL REUS W/ TWL XL LVL3 (GOWN DISPOSABLE) ×2 IMPLANT
GOWN STRL REUS W/TWL XL LVL3 (GOWN DISPOSABLE) ×2
GUIDEWIRE 0.038 PTFE COATED (WIRE) IMPLANT
GUIDEWIRE ANG ZIPWIRE 038X150 (WIRE) IMPLANT
GUIDEWIRE STR DUAL SENSOR (WIRE) IMPLANT
IV NS 1000ML (IV SOLUTION) ×2
IV NS 1000ML BAXH (IV SOLUTION) ×2 IMPLANT
IV NS IRRIG 3000ML ARTHROMATIC (IV SOLUTION) ×4 IMPLANT
KIT BALLIN UROMAX 15FX10 (LABEL) IMPLANT
KIT BALLN UROMAX 15FX4 (MISCELLANEOUS) IMPLANT
KIT BALLN UROMAX 26 75X4 (MISCELLANEOUS)
KIT ROOM TURNOVER WOR (KITS) ×4 IMPLANT
MANIFOLD NEPTUNE II (INSTRUMENTS) ×4 IMPLANT
PACK CYSTO (CUSTOM PROCEDURE TRAY) ×4 IMPLANT
SET HIGH PRES BAL DIL (LABEL)
SHEATH ACCESS URETERAL 38CM (SHEATH) IMPLANT
TUBE CONNECTING 12'X1/4 (SUCTIONS) ×1
TUBE CONNECTING 12X1/4 (SUCTIONS) ×3 IMPLANT

## 2016-08-23 NOTE — H&P (Signed)
Office Visit Report     08/14/2016   --------------------------------------------------------------------------------   Marcus Morgan  MRN: [id number]  PRIMARY CARE:  Delanna Ahmadi, MD  DOB: 1941-03-29, 75 year old Male  REFERRING:  Rana Snare, MD  SSN: -[id number]  PROVIDER:  Rana Snare, M.D.    LOCATION:  [facility] [phone]   --------------------------------------------------------------------------------   CC/HPI: Marcus Morgan presents today for follow-up of his ureteral stone.He was last seen here approximately 10 days ago and underwent stone protocol CT imaging with the following results:   IMPRESSION:  1. There are nonobstructive calculi in the collecting systems of the  kidneys bilaterally, measuring up to 8 mm in the interpolar  collecting system of the right kidney.  2. In addition, there is a 9 mm calculus in the left side of the  urinary bladder immediately adjacent to the left ureterovesicular  junction. There is no associated hydroureteronephrosis to indicate  urinary tract obstruction at this time.  3. Prostatomegaly.  4. Cholelithiasis without evidence of acute cholecystitis at this  time.  5. Normal appendix.  6. Aortic atherosclerosis.  7. Additional incidental findings, as above.     He has had noflank or abdominal discomfort. He's had no gross hematuria. He has had no recurrent voiding symptoms. PSA recently was 3.2.     ALLERGIES: Potassium Citrate ER TBCR - Other Reaction, Vomiting, drug/drug interaction with Lotrel    MEDICATIONS: Adult Aspirin Low Strength 81 MG TBDP 0 Oral  Lotrel 5-10 MG Oral Capsule 0 Oral     GU PSH: Cysto Uretero Lithotripsy - 2010 Cystoscopy Insert Stent - 2010 Renal ESWL - 2012      PSH Notes: Hand Surgery   NON-GU PSH: None   GU PMH: BPH w/LUTS - 08/04/2016, (Improving), Reassured that UA and PVR both normal today. Sounds like he had mild prostatitis which has resolved with ABX. No further ABX  required. , - 07/25/2016, Benign prostatic hyperplasia with urinary obstruction, - 08/09/2015 Flank Pain - 08/04/2016 Kidney Stone (Stable) - 08/04/2016, Nephrolithiasis, - 08/09/2015 Nocturia (Stable) - 08/04/2016, Nocturia, - 08/09/2015 Urinary Urgency - 08/04/2016 Calculus Ureter, Calculus of ureter - 2014 Gross hematuria, Gross hematuria - 2014    NON-GU PMH: Encounter for general adult medical examination without abnormal findings, Encounter for preventive health examination - 08/09/2015 Personal history of other diseases of the circulatory system, History of hypertension - 2014 Personal history of other diseases of the nervous system and sense organs, History of glaucoma - 2014    FAMILY HISTORY: Acute Myocardial Infarction - Father Family Health Status Number - No Family History Father Deceased At Age77 ___ - Runs In Family Mother Deceased At Age 74 from diabetic complicati - Runs In Family nephrolithiasis - Father Stroke Syndrome - Mother   SOCIAL HISTORY: Marital Status: Married Current Smoking Status: Patient has never smoked.  Does not use smokeless tobacco. Does not drink anymore.  Drinks 4+ caffeinated drinks per day. Patient's occupation is/was Retired.    REVIEW OF SYSTEMS:    GU Review Male:   Patient reports frequent urination and get up at night to urinate. Patient denies hard to postpone urination, burning/ pain with urination, leakage of urine, stream starts and stops, trouble starting your stream, have to strain to urinate , erection problems, and penile pain.  Gastrointestinal (Upper):   Patient denies nausea, vomiting, and indigestion/ heartburn.  Gastrointestinal (Lower):   Patient denies diarrhea and constipation.  Constitutional:   Patient denies fever, night sweats, weight  loss, and fatigue.  Skin:   Patient denies skin rash/ lesion and itching.  Eyes:   Patient denies blurred vision and double vision.  Ears/ Nose/ Throat:   Patient denies sore throat and  sinus problems.  Hematologic/Lymphatic:   Patient denies swollen glands and easy bruising.  Cardiovascular:   Patient denies leg swelling and chest pains.  Respiratory:   Patient denies cough and shortness of breath.  Endocrine:   Patient denies excessive thirst.  Musculoskeletal:   Patient denies back pain and joint pain.  Neurological:   Patient denies headaches and dizziness.  Psychologic:   Patient denies depression and anxiety.   VITAL SIGNS:      08/14/2016 10:18 AM  Weight 170 lb / 77.11 kg  Height 66 in / 167.64 cm  BP 150/83 mmHg  Pulse 62 /min  Temperature 97.2 F / 36 C  BMI 27.4 kg/m   MULTI-SYSTEM PHYSICAL EXAMINATION:    Constitutional: Well-nourished. No physical deformities. Normally developed. Good grooming.  Respiratory: No labored breathing, no use of accessory muscles.   Cardiovascular: Normal temperature, normal extremity pulses, no swelling, no varicosities.  Gastrointestinal: No mass, no tenderness, no rigidity, non obese abdomen.  Musculoskeletal: Normal gait and station of head and neck.     PAST DATA REVIEWED:  Source Of History:  Patient  Records Review:   Previous Patient Records  X-Ray Review: KUB: Reviewed Films. Discussed With Patient.     08/04/16  PSA  Total PSA 3.19 ng/dl    PROCEDURES:         KUB - 74000  A single view of the abdomen is obtained.there continues to be a suspicious 11 mm calcification in the area of the left hemipelvis.               Urinalysis w/Scope - 81001 Dipstick Dipstick Cont'd Micro  Specimen: Voided Bilirubin: Neg WBC/hpf: 0 - 5/hpf  Color: Yellow Ketones: Neg RBC/hpf: 0 - 2/hpf  Appearance: Clear Blood: Trace Intact Bacteria: NS (Not Seen)  Specific Gravity: 1.020 Protein: Neg Cystals: NS (Not Seen)  pH: 6.0 Urobilinogen: 0.2 Casts: NS (Not Seen)  Glucose: Neg Nitrites: Neg Trichomonas: Not Present    Leukocyte Esterase: Neg Mucous: Present      Epithelial Cells: NS (Not Seen)      Yeast: NS (Not Seen)       Sperm: Not Present    ASSESSMENT:      ICD-10 Details  1 GU:   BPH w/LUTS - N40.1   2   Nocturia - R35.1   3   Calculus Ureter - N20.1    PLAN:           Schedule Return Visit: 1 Month - Schedule Surgery          Document Letter(s):  Created for Patient: Clinical Summary         Notes:   Marcus Morgan had CT imaging 10 days ago which again demonstrated a distal ureteral stone versusa bladder calcification right in the area of the left ureteral vesicle junction. Whether this stone is in the ureter bladder it should undergo definitive management. I propose cystoscopy under anesthesia with treatment of the bladder stone and if this turns out to be in the distal ureter ureteroscopy with holmium laser lithotripsy and probable stent placement. We'll set this up for sometime in the next few weeks.    * Signed by Rana Snare, M.D. on 08/14/16 at 12:26 PM (EDT)*     The information contained in  this medical record document is considered private and confidential patient information. This information can only be used for the medical diagnosis and/or medical services that are being provided by the patient's selected caregivers. This information can only be distributed outside of the patient's care if the patient agrees and signs waivers of authorization for this information to be sent to an outside source or route.

## 2016-08-23 NOTE — Transfer of Care (Signed)
Immediate Anesthesia Transfer of Care Note  Patient: Marcus Morgan  Procedure(s) Performed: Procedure(s): CYSTOSCOPY  HOLMIUM LASER OF BLADDER  STONE (Left) HOLMIUM LASER APPLICATION (N/A)  Patient Location: PACU  Anesthesia Type:General  Level of Consciousness: awake, alert  and oriented  Airway & Oxygen Therapy: Patient Spontanous Breathing and Patient connected to nasal cannula oxygen  Post-op Assessment: Report given to RN  Post vital signs: Reviewed and stable  Last Vitals:  Vitals:   08/23/16 0822 08/23/16 1107  BP: (!) 148/64 127/74  Pulse: 61 73  Resp: 18 14  Temp: 36.5 C 36.3 C    Last Pain:  Vitals:   08/23/16 0822  TempSrc: Oral      Patients Stated Pain Goal: 7 ([phone] [id number])  Complications: No apparent anesthesia complications

## 2016-08-23 NOTE — Discharge Instructions (Signed)
Cystoscopy patient instructions  Following a cystoscopy, a catheter (a flexible rubber tube) is sometimes left in place to empty the bladder. This may cause some discomfort or a feeling that you need to urinate. Your doctor determines the period of time that the catheter will be left in place. You may have bloody urine for two to three days (Call your doctor if the amount of bleeding increases or does not subside).  You may pass blood clots in your urine, especially if you had a biopsy. It is not unusual to pass small blood clots and have some bloody urine a couple of weeks after your cystoscopy. Again, call your doctor if the bleeding does not subside. You may have: Dysuria (painful urination) Frequency (urinating often) Urgency (strong desire to urinate)   Activity: Rest for the remainder of the day.  Do not drive or operate equipment today.  You may resume normal activities in one to two days as instructed by your physician.   Meals: Drink plenty of liquids and eat light foods such as gelatin or soup this evening.  You may return to a normal meal plan tomorrow.  Return to Work: You may return to work in one to two days or as instructed by your physician.            You may feel some burning pain when you urinate.  This should disappear with time.  Applying moist heat to the lower abdomen or a hot tub bath may help relieve the pain. \ These symptoms are common especially if medicine is instilled into the bladder or a ureteral stent is placed. Avoiding alcohol and caffeine, such as coffee, tea, and chocolate, may help relieve these symptoms. Drink plenty of water, unless otherwise instructed. Your doctor may also prescribe an antibiotic or other medicine to reduce these symptoms.  Cystoscopy results are available soon after the procedure; biopsy results usually take two to four days. Your doctor will discuss the results of your exam with you. Before you go home, you will be given specific  instructions for follow-up care. Special Instructions:  1 If you are going home with a catheter in place do not take a tub bath until removed by your doctor.  2 You may resume your normal activities.  3 Do not drive or operate machinery if you are taking narcotic pain medicine.  4 Be sure to keep all follow-up appointments with your doctor.   5 Call Your Doctor If: The catheter is not draining  You have severe pain  You are unable to urinate  You have a fever over 101  You have severe bleeding       Urine becomes cloudy with strong, foul odor

## 2016-08-23 NOTE — Interval H&P Note (Signed)
History and Physical Interval Note:  08/23/2016 10:11 AM  Marcus Morgan  has presented today for surgery, with the diagnosis of LEFT DISTAL URETERAL VERSUS BLADDER CALCULUS  The various methods of treatment have been discussed with the patient and family. After consideration of risks, benefits and other options for treatment, the patient has consented to  Procedure(s): CYSTOSCOPY/POSSIBLE HOLMIUM LASER OF BLADDER Wyvonnia Dusky STONE /POSSIBLE LEFT URETEROSCOPY/STENT PLACEMENT (Left) HOLMIUM LASER APPLICATION (Left) as a surgical intervention .  The patient's history has been reviewed, patient examined, no change in status, stable for surgery.  I have reviewed the patient's chart and labs.  Questions were answered to the patient's satisfaction.     Arie Powell S

## 2016-08-23 NOTE — Anesthesia Preprocedure Evaluation (Addendum)
Anesthesia Evaluation  Patient identified by MRN, date of birth, ID band Patient awake    Reviewed: Allergy & Precautions, NPO status , Patient's Chart, lab work & pertinent test results  Airway Mallampati: II  TM Distance: >3 FB     Dental   Pulmonary neg pulmonary ROS, former smoker,    breath sounds clear to auscultation       Cardiovascular hypertension,  Rhythm:Regular Rate:Normal     Neuro/Psych    GI/Hepatic negative GI ROS, Neg liver ROS,   Endo/Other  negative endocrine ROS  Renal/GU negative Renal ROS     Musculoskeletal   Abdominal   Peds  Hematology   Anesthesia Other Findings   Reproductive/Obstetrics                             Anesthesia Physical Anesthesia Plan  ASA: III  Anesthesia Plan: General   Post-op Pain Management:    Induction: Intravenous  Airway Management Planned: LMA  Additional Equipment:   Intra-op Plan:   Post-operative Plan: Extubation in OR  Informed Consent: I have reviewed the patients History and Physical, chart, labs and discussed the procedure including the risks, benefits and alternatives for the proposed anesthesia with the patient or authorized representative who has indicated his/her understanding and acceptance.   Dental advisory given  Plan Discussed with: CRNA and Anesthesiologist  Anesthesia Plan Comments:         Anesthesia Quick Evaluation

## 2016-08-23 NOTE — Op Note (Signed)
Preoperative diagnosis: 10 mm bladder versus distal ureteral calculus Postoperative diagnosis: 10 mm bladder calculus  Procedure: Cystoscopy with holmium laser lithotripsy of bladder stone   Surgeon: Bernestine Amass M.D.  Anesthesia: Gen.  Indications: Marcus Morgan is currently 75 years of age. He has a history of nephrolithiasis. Recent imaging studies demonstrated nonobstructing bilateral renal calculi. There was a 9-10 mm calcification on the left side of the bladder adjacent to the left ureteral orifice. It was unclear on imaging whether this was a distal ureteral stone or a bladder calculus. He presents now for definitive assessment and treatment.     Technique and findings: Patient was brought the operating room where he had successful induction of general anesthesia. He was placed in lithotomy position and prepped draped usual manner. He received perioperative antibiotics and placement of PAS compression boots. Appropriate surgical timeout was performed. Cystoscopy was performed with an unremarkable anterior urethra. Prostatic urethra showed visual obstruction with a fairly prominent high riding median bar. Within the bladder a 10 mm dense stone was encountered. On fluoroscopy this was the stone noted on previous CT imaging and clearly was in the bladder at this time. Both ureteral orifices appeared unremarkable. Holmium laser lithotripter fiber was used at 1.2 J 8 Hz. The stone was broken into innumerable pieces which were then flushed from the bladder. There are no obvious complications or problems. The patient was brought to PACU in stable condition.

## 2016-08-23 NOTE — Anesthesia Procedure Notes (Signed)
Procedure Name: LMA Insertion Date/Time: 08/23/2016 10:32 AM Performed by: Bethena Roys T Pre-anesthesia Checklist: Patient identified, Emergency Drugs available, Suction available and Patient being monitored Patient Re-evaluated:Patient Re-evaluated prior to inductionOxygen Delivery Method: Circle system utilized Preoxygenation: Pre-oxygenation with 100% oxygen Intubation Type: IV induction Ventilation: Mask ventilation without difficulty LMA: LMA inserted LMA Size: 5.0 Number of attempts: 1 Airway Equipment and Method: Bite block Placement Confirmation: positive ETCO2 Dental Injury: Teeth and Oropharynx as per pre-operative assessment

## 2016-08-24 ENCOUNTER — Encounter (HOSPITAL_BASED_OUTPATIENT_CLINIC_OR_DEPARTMENT_OTHER): Payer: Self-pay | Admitting: Urology

## 2016-08-24 NOTE — Anesthesia Postprocedure Evaluation (Signed)
Anesthesia Post Note  Patient: Marcus Morgan  Procedure(s) Performed: Procedure(s) (LRB): CYSTOSCOPY  HOLMIUM LASER OF BLADDER  STONE (Left) HOLMIUM LASER APPLICATION (N/A)  Patient location during evaluation: PACU Anesthesia Type: General Level of consciousness: awake Pain management: pain level controlled Respiratory status: spontaneous breathing Cardiovascular status: stable Anesthetic complications: no    Last Vitals:  Vitals:   08/23/16 1130 08/23/16 1222  BP: 139/67 140/64  Pulse: 67 62  Resp: 13 12  Temp:  36.7 C    Last Pain:  Vitals:   08/24/16 1438  TempSrc:   PainSc: 0-No pain                 EDWARDS,Liany Mumpower

## 2016-10-02 DIAGNOSIS — N210 Calculus in bladder: Secondary | ICD-10-CM | POA: Diagnosis not present

## 2016-10-02 DIAGNOSIS — N200 Calculus of kidney: Secondary | ICD-10-CM | POA: Diagnosis not present

## 2016-12-07 DIAGNOSIS — H01002 Unspecified blepharitis right lower eyelid: Secondary | ICD-10-CM | POA: Diagnosis not present

## 2016-12-07 DIAGNOSIS — H524 Presbyopia: Secondary | ICD-10-CM | POA: Diagnosis not present

## 2016-12-07 DIAGNOSIS — H01004 Unspecified blepharitis left upper eyelid: Secondary | ICD-10-CM | POA: Diagnosis not present

## 2016-12-07 DIAGNOSIS — H01005 Unspecified blepharitis left lower eyelid: Secondary | ICD-10-CM | POA: Diagnosis not present

## 2016-12-07 DIAGNOSIS — H01001 Unspecified blepharitis right upper eyelid: Secondary | ICD-10-CM | POA: Diagnosis not present

## 2016-12-07 DIAGNOSIS — H25813 Combined forms of age-related cataract, bilateral: Secondary | ICD-10-CM | POA: Diagnosis not present

## 2016-12-11 DIAGNOSIS — Z23 Encounter for immunization: Secondary | ICD-10-CM | POA: Diagnosis not present

## 2016-12-11 DIAGNOSIS — N3941 Urge incontinence: Secondary | ICD-10-CM | POA: Diagnosis not present

## 2016-12-11 DIAGNOSIS — Z0000 Encounter for general adult medical examination without abnormal findings: Secondary | ICD-10-CM | POA: Diagnosis not present

## 2016-12-11 DIAGNOSIS — Z1389 Encounter for screening for other disorder: Secondary | ICD-10-CM | POA: Diagnosis not present

## 2016-12-11 DIAGNOSIS — Z1211 Encounter for screening for malignant neoplasm of colon: Secondary | ICD-10-CM | POA: Diagnosis not present

## 2016-12-11 DIAGNOSIS — I10 Essential (primary) hypertension: Secondary | ICD-10-CM | POA: Diagnosis not present

## 2017-04-24 DIAGNOSIS — H608X3 Other otitis externa, bilateral: Secondary | ICD-10-CM | POA: Diagnosis not present

## 2017-04-24 DIAGNOSIS — H9113 Presbycusis, bilateral: Secondary | ICD-10-CM | POA: Diagnosis not present

## 2017-04-24 DIAGNOSIS — H6123 Impacted cerumen, bilateral: Secondary | ICD-10-CM | POA: Diagnosis not present

## 2017-06-14 DIAGNOSIS — S70362A Insect bite (nonvenomous), left thigh, initial encounter: Secondary | ICD-10-CM | POA: Diagnosis not present

## 2017-06-14 DIAGNOSIS — W57XXXA Bitten or stung by nonvenomous insect and other nonvenomous arthropods, initial encounter: Secondary | ICD-10-CM | POA: Diagnosis not present

## 2017-07-23 DIAGNOSIS — I499 Cardiac arrhythmia, unspecified: Secondary | ICD-10-CM | POA: Diagnosis not present

## 2017-07-23 DIAGNOSIS — I10 Essential (primary) hypertension: Secondary | ICD-10-CM | POA: Diagnosis not present

## 2017-07-23 DIAGNOSIS — S30861S Insect bite (nonvenomous) of abdominal wall, sequela: Secondary | ICD-10-CM | POA: Diagnosis not present

## 2017-07-23 DIAGNOSIS — W57XXXS Bitten or stung by nonvenomous insect and other nonvenomous arthropods, sequela: Secondary | ICD-10-CM | POA: Diagnosis not present

## 2017-10-12 DIAGNOSIS — Z23 Encounter for immunization: Secondary | ICD-10-CM | POA: Diagnosis not present

## 2017-12-11 DIAGNOSIS — Z1389 Encounter for screening for other disorder: Secondary | ICD-10-CM | POA: Diagnosis not present

## 2017-12-11 DIAGNOSIS — Z0000 Encounter for general adult medical examination without abnormal findings: Secondary | ICD-10-CM | POA: Diagnosis not present

## 2017-12-11 DIAGNOSIS — E782 Mixed hyperlipidemia: Secondary | ICD-10-CM | POA: Diagnosis not present

## 2017-12-11 DIAGNOSIS — Z1211 Encounter for screening for malignant neoplasm of colon: Secondary | ICD-10-CM | POA: Diagnosis not present

## 2017-12-11 DIAGNOSIS — I10 Essential (primary) hypertension: Secondary | ICD-10-CM | POA: Diagnosis not present

## 2017-12-11 DIAGNOSIS — Z23 Encounter for immunization: Secondary | ICD-10-CM | POA: Diagnosis not present

## 2017-12-13 DIAGNOSIS — H01004 Unspecified blepharitis left upper eyelid: Secondary | ICD-10-CM | POA: Diagnosis not present

## 2017-12-13 DIAGNOSIS — H25813 Combined forms of age-related cataract, bilateral: Secondary | ICD-10-CM | POA: Diagnosis not present

## 2017-12-13 DIAGNOSIS — H524 Presbyopia: Secondary | ICD-10-CM | POA: Diagnosis not present

## 2017-12-13 DIAGNOSIS — H01002 Unspecified blepharitis right lower eyelid: Secondary | ICD-10-CM | POA: Diagnosis not present

## 2017-12-13 DIAGNOSIS — H5213 Myopia, bilateral: Secondary | ICD-10-CM | POA: Diagnosis not present

## 2017-12-13 DIAGNOSIS — H01001 Unspecified blepharitis right upper eyelid: Secondary | ICD-10-CM | POA: Diagnosis not present

## 2017-12-14 DIAGNOSIS — N200 Calculus of kidney: Secondary | ICD-10-CM | POA: Diagnosis not present

## 2017-12-14 DIAGNOSIS — N401 Enlarged prostate with lower urinary tract symptoms: Secondary | ICD-10-CM | POA: Diagnosis not present

## 2017-12-14 DIAGNOSIS — R351 Nocturia: Secondary | ICD-10-CM | POA: Diagnosis not present

## 2018-04-12 ENCOUNTER — Other Ambulatory Visit: Payer: Self-pay | Admitting: Internal Medicine

## 2018-04-12 ENCOUNTER — Ambulatory Visit
Admission: RE | Admit: 2018-04-12 | Discharge: 2018-04-12 | Disposition: A | Discharge status: Home / Self Care | Payer: Medicare Other | Source: Ambulatory Visit | Attending: Internal Medicine | Admitting: Internal Medicine

## 2018-04-12 DIAGNOSIS — M545 Low back pain: Secondary | ICD-10-CM

## 2018-04-12 DIAGNOSIS — M47814 Spondylosis without myelopathy or radiculopathy, thoracic region: Secondary | ICD-10-CM | POA: Diagnosis not present

## 2018-04-12 DIAGNOSIS — M549 Dorsalgia, unspecified: Secondary | ICD-10-CM | POA: Diagnosis not present

## 2018-06-13 DIAGNOSIS — H25813 Combined forms of age-related cataract, bilateral: Secondary | ICD-10-CM | POA: Diagnosis not present

## 2018-07-25 DIAGNOSIS — H6123 Impacted cerumen, bilateral: Secondary | ICD-10-CM | POA: Diagnosis not present

## 2018-07-25 DIAGNOSIS — H608X3 Other otitis externa, bilateral: Secondary | ICD-10-CM | POA: Diagnosis not present

## 2018-07-25 DIAGNOSIS — H9113 Presbycusis, bilateral: Secondary | ICD-10-CM | POA: Diagnosis not present

## 2018-09-04 DIAGNOSIS — L820 Inflamed seborrheic keratosis: Secondary | ICD-10-CM | POA: Diagnosis not present

## 2018-09-04 DIAGNOSIS — L821 Other seborrheic keratosis: Secondary | ICD-10-CM | POA: Diagnosis not present

## 2018-09-04 DIAGNOSIS — D485 Neoplasm of uncertain behavior of skin: Secondary | ICD-10-CM | POA: Diagnosis not present

## 2018-12-13 DIAGNOSIS — R972 Elevated prostate specific antigen [PSA]: Secondary | ICD-10-CM | POA: Diagnosis not present

## 2018-12-13 DIAGNOSIS — I10 Essential (primary) hypertension: Secondary | ICD-10-CM | POA: Diagnosis not present

## 2018-12-13 DIAGNOSIS — Z125 Encounter for screening for malignant neoplasm of prostate: Secondary | ICD-10-CM | POA: Diagnosis not present

## 2018-12-13 DIAGNOSIS — E782 Mixed hyperlipidemia: Secondary | ICD-10-CM | POA: Diagnosis not present

## 2018-12-30 DIAGNOSIS — M65342 Trigger finger, left ring finger: Secondary | ICD-10-CM | POA: Diagnosis not present

## 2019-04-09 DIAGNOSIS — M65342 Trigger finger, left ring finger: Secondary | ICD-10-CM | POA: Diagnosis not present

## 2019-05-16 DIAGNOSIS — M65342 Trigger finger, left ring finger: Secondary | ICD-10-CM | POA: Diagnosis not present

## 2019-06-13 DIAGNOSIS — R3915 Urgency of urination: Secondary | ICD-10-CM | POA: Diagnosis not present

## 2019-06-13 DIAGNOSIS — N401 Enlarged prostate with lower urinary tract symptoms: Secondary | ICD-10-CM | POA: Diagnosis not present

## 2019-06-13 DIAGNOSIS — N200 Calculus of kidney: Secondary | ICD-10-CM | POA: Diagnosis not present

## 2019-06-29 IMAGING — CR DG THORACIC SPINE 3V
3 series · 3 of 3 positions shown · non-contrast
Comparison: Chest x-ray dated July 13, 2004.

CLINICAL DATA: Right upper back pain for the past 6 months.

EXAM:
THORACIC SPINE - 3 VIEWS

[t t-spine a.p.]
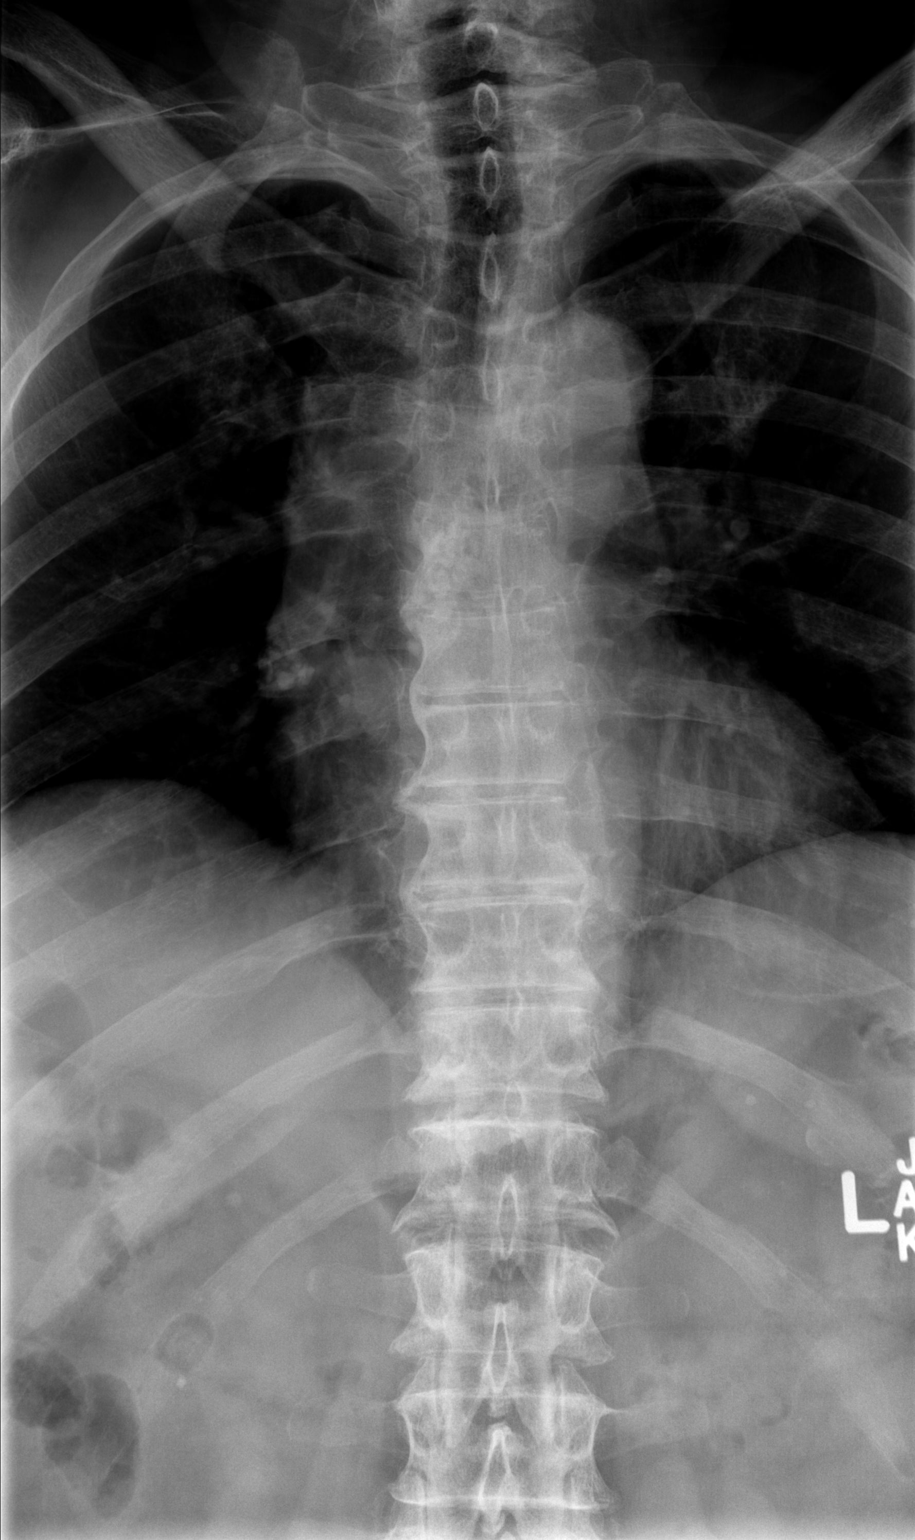

[t t-spine lat *]
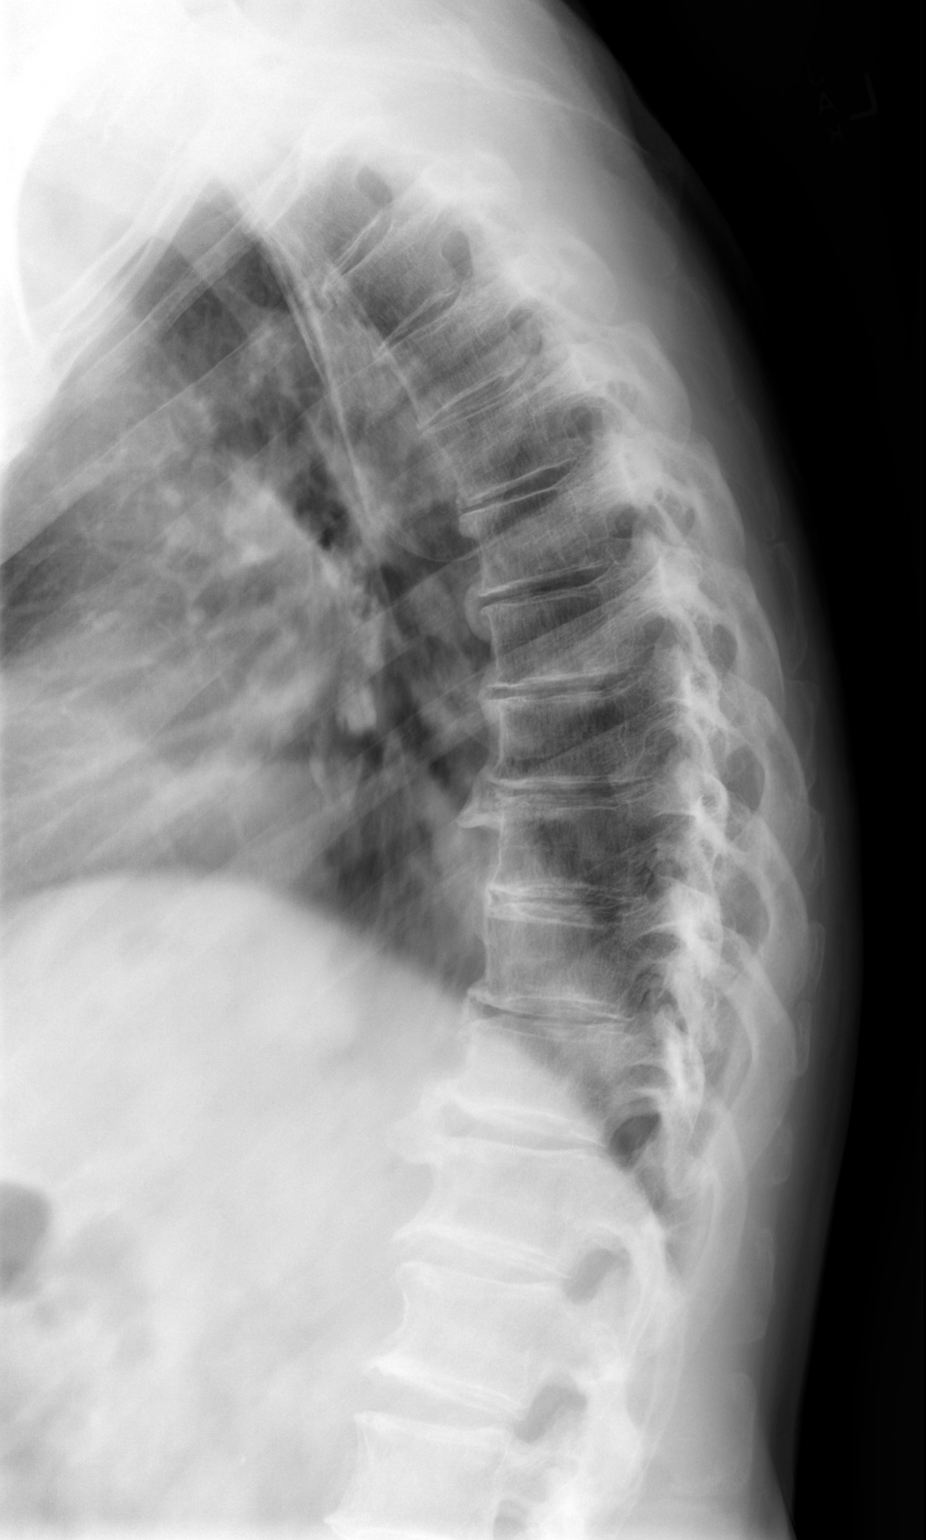

[t swimmers]
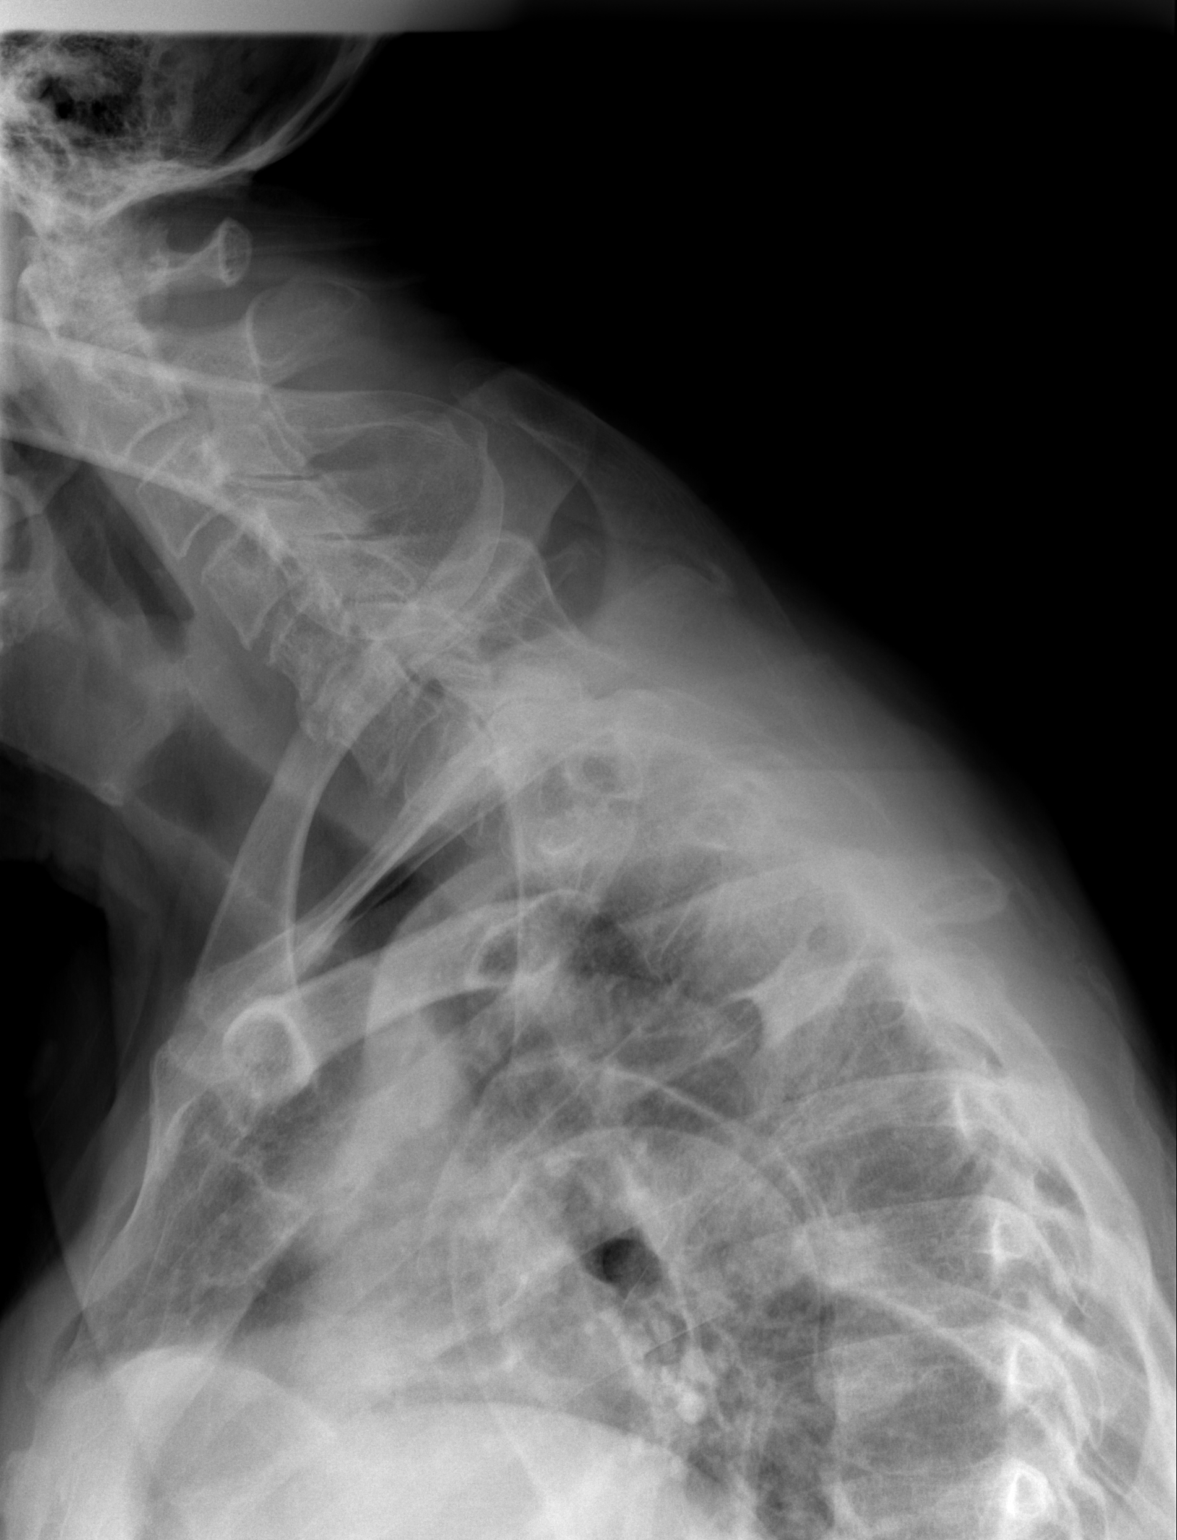

[3 of 3 positions shown; findings below may reference images not displayed]

FINDINGS: Twelve rib-bearing thoracic vertebral bodies. No acute fracture or
subluxation. Vertebral body heights are preserved. Alignment is
normal. Mild anterior endplate spurring throughout the mid to lower
thoracic spine. Intervertebral disc spaces are maintained.
IMPRESSION: Mild degenerative changes of the mid to lower thoracic spine.

## 2019-08-05 DIAGNOSIS — Z23 Encounter for immunization: Secondary | ICD-10-CM | POA: Diagnosis not present

## 2019-09-23 DIAGNOSIS — H9113 Presbycusis, bilateral: Secondary | ICD-10-CM | POA: Diagnosis not present

## 2019-09-23 DIAGNOSIS — H6123 Impacted cerumen, bilateral: Secondary | ICD-10-CM | POA: Diagnosis not present

## 2019-09-23 DIAGNOSIS — H608X3 Other otitis externa, bilateral: Secondary | ICD-10-CM | POA: Diagnosis not present

## 2020-01-06 DIAGNOSIS — E782 Mixed hyperlipidemia: Secondary | ICD-10-CM | POA: Diagnosis not present

## 2020-01-06 DIAGNOSIS — I10 Essential (primary) hypertension: Secondary | ICD-10-CM | POA: Diagnosis not present

## 2020-01-06 DIAGNOSIS — Z23 Encounter for immunization: Secondary | ICD-10-CM | POA: Diagnosis not present

## 2020-01-06 DIAGNOSIS — Z0000 Encounter for general adult medical examination without abnormal findings: Secondary | ICD-10-CM | POA: Diagnosis not present

## 2020-01-06 DIAGNOSIS — Z1389 Encounter for screening for other disorder: Secondary | ICD-10-CM | POA: Diagnosis not present

## 2020-03-23 DIAGNOSIS — H6123 Impacted cerumen, bilateral: Secondary | ICD-10-CM | POA: Diagnosis not present

## 2020-03-23 DIAGNOSIS — H9113 Presbycusis, bilateral: Secondary | ICD-10-CM | POA: Diagnosis not present

## 2020-03-23 DIAGNOSIS — H608X3 Other otitis externa, bilateral: Secondary | ICD-10-CM | POA: Diagnosis not present

## 2020-05-03 DIAGNOSIS — J22 Unspecified acute lower respiratory infection: Secondary | ICD-10-CM | POA: Diagnosis not present

## 2020-05-03 DIAGNOSIS — Z03818 Encounter for observation for suspected exposure to other biological agents ruled out: Secondary | ICD-10-CM | POA: Diagnosis not present

## 2020-07-07 DIAGNOSIS — R351 Nocturia: Secondary | ICD-10-CM | POA: Diagnosis not present

## 2020-07-07 DIAGNOSIS — N200 Calculus of kidney: Secondary | ICD-10-CM | POA: Diagnosis not present

## 2020-07-07 DIAGNOSIS — N401 Enlarged prostate with lower urinary tract symptoms: Secondary | ICD-10-CM | POA: Diagnosis not present

## 2020-08-06 DIAGNOSIS — E782 Mixed hyperlipidemia: Secondary | ICD-10-CM | POA: Diagnosis not present

## 2020-08-06 DIAGNOSIS — N401 Enlarged prostate with lower urinary tract symptoms: Secondary | ICD-10-CM | POA: Diagnosis not present

## 2020-08-06 DIAGNOSIS — I10 Essential (primary) hypertension: Secondary | ICD-10-CM | POA: Diagnosis not present

## 2020-08-06 DIAGNOSIS — R739 Hyperglycemia, unspecified: Secondary | ICD-10-CM | POA: Diagnosis not present

## 2020-11-05 DIAGNOSIS — T1512XA Foreign body in conjunctival sac, left eye, initial encounter: Secondary | ICD-10-CM | POA: Diagnosis not present

## 2020-11-05 DIAGNOSIS — S0502XA Injury of conjunctiva and corneal abrasion without foreign body, left eye, initial encounter: Secondary | ICD-10-CM | POA: Diagnosis not present

## 2020-11-05 DIAGNOSIS — H0100A Unspecified blepharitis right eye, upper and lower eyelids: Secondary | ICD-10-CM | POA: Diagnosis not present

## 2020-11-10 DIAGNOSIS — M1812 Unilateral primary osteoarthritis of first carpometacarpal joint, left hand: Secondary | ICD-10-CM | POA: Diagnosis not present

## 2020-11-10 DIAGNOSIS — M65342 Trigger finger, left ring finger: Secondary | ICD-10-CM | POA: Diagnosis not present

## 2020-11-10 DIAGNOSIS — M65331 Trigger finger, right middle finger: Secondary | ICD-10-CM | POA: Diagnosis not present

## 2020-11-11 DIAGNOSIS — H0100A Unspecified blepharitis right eye, upper and lower eyelids: Secondary | ICD-10-CM | POA: Diagnosis not present

## 2020-11-11 DIAGNOSIS — H5213 Myopia, bilateral: Secondary | ICD-10-CM | POA: Diagnosis not present

## 2020-11-11 DIAGNOSIS — H25813 Combined forms of age-related cataract, bilateral: Secondary | ICD-10-CM | POA: Diagnosis not present

## 2020-11-11 DIAGNOSIS — H40013 Open angle with borderline findings, low risk, bilateral: Secondary | ICD-10-CM | POA: Diagnosis not present

## 2020-11-11 DIAGNOSIS — H524 Presbyopia: Secondary | ICD-10-CM | POA: Diagnosis not present

## 2020-11-11 DIAGNOSIS — H52223 Regular astigmatism, bilateral: Secondary | ICD-10-CM | POA: Diagnosis not present

## 2020-12-03 DIAGNOSIS — H6123 Impacted cerumen, bilateral: Secondary | ICD-10-CM | POA: Diagnosis not present

## 2020-12-03 DIAGNOSIS — H608X3 Other otitis externa, bilateral: Secondary | ICD-10-CM | POA: Diagnosis not present

## 2020-12-03 DIAGNOSIS — H9113 Presbycusis, bilateral: Secondary | ICD-10-CM | POA: Diagnosis not present

## 2021-01-10 DIAGNOSIS — R739 Hyperglycemia, unspecified: Secondary | ICD-10-CM | POA: Diagnosis not present

## 2021-01-10 DIAGNOSIS — I10 Essential (primary) hypertension: Secondary | ICD-10-CM | POA: Diagnosis not present

## 2021-01-10 DIAGNOSIS — E782 Mixed hyperlipidemia: Secondary | ICD-10-CM | POA: Diagnosis not present

## 2021-01-12 DIAGNOSIS — I10 Essential (primary) hypertension: Secondary | ICD-10-CM | POA: Diagnosis not present

## 2021-01-12 DIAGNOSIS — N401 Enlarged prostate with lower urinary tract symptoms: Secondary | ICD-10-CM | POA: Diagnosis not present

## 2021-01-12 DIAGNOSIS — E782 Mixed hyperlipidemia: Secondary | ICD-10-CM | POA: Diagnosis not present

## 2021-01-12 DIAGNOSIS — Z0000 Encounter for general adult medical examination without abnormal findings: Secondary | ICD-10-CM | POA: Diagnosis not present

## 2021-01-12 DIAGNOSIS — R7303 Prediabetes: Secondary | ICD-10-CM | POA: Diagnosis not present

## 2021-01-12 DIAGNOSIS — Z1389 Encounter for screening for other disorder: Secondary | ICD-10-CM | POA: Diagnosis not present

## 2021-01-13 DIAGNOSIS — D2271 Melanocytic nevi of right lower limb, including hip: Secondary | ICD-10-CM | POA: Diagnosis not present

## 2021-01-13 DIAGNOSIS — D2272 Melanocytic nevi of left lower limb, including hip: Secondary | ICD-10-CM | POA: Diagnosis not present

## 2021-01-13 DIAGNOSIS — D485 Neoplasm of uncertain behavior of skin: Secondary | ICD-10-CM | POA: Diagnosis not present

## 2021-01-13 DIAGNOSIS — D2361 Other benign neoplasm of skin of right upper limb, including shoulder: Secondary | ICD-10-CM | POA: Diagnosis not present

## 2021-02-09 DIAGNOSIS — M65331 Trigger finger, right middle finger: Secondary | ICD-10-CM | POA: Diagnosis not present

## 2021-03-07 DIAGNOSIS — E782 Mixed hyperlipidemia: Secondary | ICD-10-CM | POA: Diagnosis not present

## 2021-03-11 DIAGNOSIS — E7800 Pure hypercholesterolemia, unspecified: Secondary | ICD-10-CM | POA: Diagnosis not present

## 2021-05-11 ENCOUNTER — Other Ambulatory Visit: Payer: Self-pay | Admitting: Orthopedic Surgery

## 2021-05-11 DIAGNOSIS — E7800 Pure hypercholesterolemia, unspecified: Secondary | ICD-10-CM | POA: Diagnosis not present

## 2021-05-11 DIAGNOSIS — M65331 Trigger finger, right middle finger: Secondary | ICD-10-CM | POA: Diagnosis not present

## 2021-05-12 DIAGNOSIS — H0100A Unspecified blepharitis right eye, upper and lower eyelids: Secondary | ICD-10-CM | POA: Diagnosis not present

## 2021-05-12 DIAGNOSIS — H524 Presbyopia: Secondary | ICD-10-CM | POA: Diagnosis not present

## 2021-05-12 DIAGNOSIS — H25813 Combined forms of age-related cataract, bilateral: Secondary | ICD-10-CM | POA: Diagnosis not present

## 2021-05-12 DIAGNOSIS — H40013 Open angle with borderline findings, low risk, bilateral: Secondary | ICD-10-CM | POA: Diagnosis not present

## 2021-05-12 DIAGNOSIS — H52223 Regular astigmatism, bilateral: Secondary | ICD-10-CM | POA: Diagnosis not present

## 2021-05-12 DIAGNOSIS — H5213 Myopia, bilateral: Secondary | ICD-10-CM | POA: Diagnosis not present

## 2021-05-31 ENCOUNTER — Other Ambulatory Visit: Payer: Self-pay

## 2021-05-31 ENCOUNTER — Encounter (HOSPITAL_BASED_OUTPATIENT_CLINIC_OR_DEPARTMENT_OTHER): Payer: Self-pay | Admitting: Orthopedic Surgery

## 2021-06-06 ENCOUNTER — Encounter (HOSPITAL_BASED_OUTPATIENT_CLINIC_OR_DEPARTMENT_OTHER)
Admission: RE | Admit: 2021-06-06 | Discharge: 2021-06-06 | Disposition: A | Discharge status: Home / Self Care | Payer: Medicare Other | Source: Ambulatory Visit | Attending: Orthopedic Surgery | Admitting: Orthopedic Surgery

## 2021-06-06 DIAGNOSIS — Z885 Allergy status to narcotic agent status: Secondary | ICD-10-CM | POA: Diagnosis not present

## 2021-06-06 DIAGNOSIS — M65331 Trigger finger, right middle finger: Secondary | ICD-10-CM | POA: Diagnosis not present

## 2021-06-06 DIAGNOSIS — Z87891 Personal history of nicotine dependence: Secondary | ICD-10-CM | POA: Diagnosis not present

## 2021-06-06 NOTE — Progress Notes (Signed)

## 2021-06-07 ENCOUNTER — Ambulatory Visit (HOSPITAL_BASED_OUTPATIENT_CLINIC_OR_DEPARTMENT_OTHER): Payer: Medicare Other | Admitting: Anesthesiology

## 2021-06-07 ENCOUNTER — Other Ambulatory Visit: Payer: Self-pay

## 2021-06-07 ENCOUNTER — Ambulatory Visit (HOSPITAL_BASED_OUTPATIENT_CLINIC_OR_DEPARTMENT_OTHER)
Admission: RE | Admit: 2021-06-07 | Discharge: 2021-06-07 | Disposition: A | Discharge status: Home / Self Care | Payer: Medicare Other | Attending: Orthopedic Surgery | Admitting: Orthopedic Surgery

## 2021-06-07 ENCOUNTER — Encounter (HOSPITAL_BASED_OUTPATIENT_CLINIC_OR_DEPARTMENT_OTHER): Payer: Self-pay | Admitting: Orthopedic Surgery

## 2021-06-07 ENCOUNTER — Encounter (HOSPITAL_BASED_OUTPATIENT_CLINIC_OR_DEPARTMENT_OTHER)
Admission: RE | Disposition: A | Discharge status: Home / Self Care | Payer: Self-pay | Source: Home / Self Care | Attending: Orthopedic Surgery

## 2021-06-07 DIAGNOSIS — Z885 Allergy status to narcotic agent status: Secondary | ICD-10-CM | POA: Insufficient documentation

## 2021-06-07 DIAGNOSIS — M65331 Trigger finger, right middle finger: Secondary | ICD-10-CM | POA: Diagnosis not present

## 2021-06-07 DIAGNOSIS — I10 Essential (primary) hypertension: Secondary | ICD-10-CM | POA: Diagnosis not present

## 2021-06-07 DIAGNOSIS — Z87891 Personal history of nicotine dependence: Secondary | ICD-10-CM | POA: Diagnosis not present

## 2021-06-07 HISTORY — PX: TRIGGER FINGER RELEASE: SHX641

## 2021-06-07 SURGERY — RELEASE, A1 PULLEY, FOR TRIGGER FINGER
Anesthesia: Monitor Anesthesia Care | Laterality: Right

## 2021-06-07 MED ORDER — BUPIVACAINE HCL (PF) 0.25 % IJ SOLN
INTRAMUSCULAR | Status: DC | PRN
  Administered 2021-06-07: 5 mL

## 2021-06-07 MED ORDER — OXYCODONE HCL 5 MG/5ML PO SOLN
5.0000 mg | Freq: Once | ORAL | Status: DC | PRN
Start: 2021-06-07 — End: 2021-06-07

## 2021-06-07 MED ORDER — CEFAZOLIN SODIUM-DEXTROSE 2-4 GM/100ML-% IV SOLN
INTRAVENOUS | Status: AC
  Filled 2021-06-07: qty 100

## 2021-06-07 MED ORDER — LACTATED RINGERS IV SOLN: INTRAVENOUS | Status: DC

## 2021-06-07 MED ORDER — ACETAMINOPHEN 325 MG PO TABS: 325.0000 mg | ORAL_TABLET | ORAL | Status: DC | PRN

## 2021-06-07 MED ORDER — FENTANYL CITRATE (PF) 100 MCG/2ML IJ SOLN: 25.0000 ug | INTRAMUSCULAR | Status: DC | PRN

## 2021-06-07 MED ORDER — LIDOCAINE HCL (PF) 0.5 % IJ SOLN
INTRAMUSCULAR | Status: DC | PRN
  Administered 2021-06-07: 30 mL via INTRAVENOUS

## 2021-06-07 MED ORDER — PROPOFOL 10 MG/ML IV BOLUS
INTRAVENOUS | Status: DC | PRN
  Administered 2021-06-07 (×2): 20 mg via INTRAVENOUS

## 2021-06-07 MED ORDER — CEFAZOLIN SODIUM-DEXTROSE 2-4 GM/100ML-% IV SOLN
2.0000 g | INTRAVENOUS | Status: AC
  Administered 2021-06-07: 2 g via INTRAVENOUS

## 2021-06-07 MED ORDER — PROPOFOL 10 MG/ML IV BOLUS
INTRAVENOUS | Status: AC
  Filled 2021-06-07: qty 20

## 2021-06-07 MED ORDER — OXYCODONE HCL 5 MG PO TABS: 5.0000 mg | ORAL_TABLET | Freq: Once | ORAL | Status: DC | PRN

## 2021-06-07 MED ORDER — ACETAMINOPHEN 160 MG/5ML PO SOLN: 325.0000 mg | ORAL | Status: DC | PRN

## 2021-06-07 MED ORDER — ONDANSETRON HCL 4 MG/2ML IJ SOLN
INTRAMUSCULAR | Status: AC
  Filled 2021-06-07: qty 2

## 2021-06-07 MED ORDER — MEPERIDINE HCL 25 MG/ML IJ SOLN: 6.2500 mg | INTRAMUSCULAR | Status: DC | PRN

## 2021-06-07 MED ORDER — ONDANSETRON HCL 4 MG/2ML IJ SOLN
INTRAMUSCULAR | Status: DC | PRN
  Administered 2021-06-07: 4 mg via INTRAVENOUS

## 2021-06-07 MED ORDER — ONDANSETRON HCL 4 MG/2ML IJ SOLN: 4.0000 mg | Freq: Once | INTRAMUSCULAR | Status: DC | PRN

## 2021-06-07 MED ORDER — FENTANYL CITRATE (PF) 100 MCG/2ML IJ SOLN
INTRAMUSCULAR | Status: DC | PRN
  Administered 2021-06-07: 100 ug via INTRAVENOUS

## 2021-06-07 MED ORDER — FENTANYL CITRATE (PF) 100 MCG/2ML IJ SOLN
INTRAMUSCULAR | Status: AC
  Filled 2021-06-07: qty 2

## 2021-06-07 MED ORDER — TRAMADOL HCL 50 MG PO TABS: 50.0000 mg | ORAL_TABLET | Freq: Four times a day (QID) | ORAL | 0 refills | Status: AC | PRN

## 2021-06-07 SURGICAL SUPPLY — 35 items
APL PRP STRL LF DISP 70% ISPRP (MISCELLANEOUS) ×1
BLADE SURG 15 STRL LF DISP TIS (BLADE) ×1 IMPLANT
BLADE SURG 15 STRL SS (BLADE) ×2
BNDG CMPR 9X4 STRL LF SNTH (GAUZE/BANDAGES/DRESSINGS) ×1
BNDG COHESIVE 2X5 TAN NS LF (GAUZE/BANDAGES/DRESSINGS) ×2 IMPLANT
BNDG COHESIVE 2X5 TAN ST LF (GAUZE/BANDAGES/DRESSINGS) ×2 IMPLANT
BNDG ESMARK 4X9 LF (GAUZE/BANDAGES/DRESSINGS) ×2 IMPLANT
CHLORAPREP W/TINT 26 (MISCELLANEOUS) ×2 IMPLANT
CORD BIPOLAR FORCEPS 12FT (ELECTRODE) IMPLANT
COVER BACK TABLE 60X90IN (DRAPES) ×2 IMPLANT
COVER MAYO STAND STRL (DRAPES) ×2 IMPLANT
CUFF TOURN SGL QUICK 18X4 (TOURNIQUET CUFF) ×2 IMPLANT
DECANTER SPIKE VIAL GLASS SM (MISCELLANEOUS) IMPLANT
DRAPE EXTREMITY T 121X128X90 (DISPOSABLE) ×2 IMPLANT
DRAPE SURG 17X23 STRL (DRAPES) ×2 IMPLANT
GAUZE 4X4 16PLY ~~LOC~~+RFID DBL (SPONGE) ×2 IMPLANT
GAUZE SPONGE 4X4 12PLY STRL (GAUZE/BANDAGES/DRESSINGS) ×2 IMPLANT
GAUZE XEROFORM 1X8 LF (GAUZE/BANDAGES/DRESSINGS) ×2 IMPLANT
GLOVE SURG ORTHO LTX SZ8 (GLOVE) ×2 IMPLANT
GLOVE SURG POLYISO LF SZ6.5 (GLOVE) ×2 IMPLANT
GLOVE SURG UNDER POLY LF SZ6.5 (GLOVE) ×2 IMPLANT
GLOVE SURG UNDER POLY LF SZ7 (GLOVE) ×2 IMPLANT
GLOVE SURG UNDER POLY LF SZ8.5 (GLOVE) ×2 IMPLANT
GOWN STRL REUS W/ TWL LRG LVL3 (GOWN DISPOSABLE) ×1 IMPLANT
GOWN STRL REUS W/TWL LRG LVL3 (GOWN DISPOSABLE) ×2
GOWN STRL REUS W/TWL XL LVL3 (GOWN DISPOSABLE) ×2 IMPLANT
NEEDLE PRECISIONGLIDE 27X1.5 (NEEDLE) ×2 IMPLANT
NS IRRIG 1000ML POUR BTL (IV SOLUTION) ×2 IMPLANT
PACK BASIN DAY SURGERY FS (CUSTOM PROCEDURE TRAY) ×2 IMPLANT
STOCKINETTE 4X48 STRL (DRAPES) ×2 IMPLANT
SUT ETHILON 4 0 PS 2 18 (SUTURE) ×2 IMPLANT
SYR BULB EAR ULCER 3OZ GRN STR (SYRINGE) ×2 IMPLANT
SYR CONTROL 10ML LL (SYRINGE) ×2 IMPLANT
TOWEL GREEN STERILE FF (TOWEL DISPOSABLE) ×2 IMPLANT
UNDERPAD 30X36 HEAVY ABSORB (UNDERPADS AND DIAPERS) ×2 IMPLANT

## 2021-06-07 NOTE — Anesthesia Procedure Notes (Signed)
Anesthesia Regional Block: Bier block (IV Regional)   Pre-Anesthetic Checklist: , timeout performed,  Correct Patient, Correct Site, Correct Laterality,  Correct Procedure,, site marked,  Surgical consent,  At surgeon's request  Laterality: Right         Needles:  Injection technique: Single-shot  Needle Type: Other      Needle Gauge: 22     Additional Needles:   Procedures:,,,,, intact distal pulses, Esmarch exsanguination,  Single tourniquet utilized    Narrative:  Start time: 06/07/2021 11:54 AM End time: 06/07/2021 11:54 AM Injection made incrementally with aspirations every 30 mL.  Performed by: Personally

## 2021-06-07 NOTE — Discharge Instructions (Addendum)

## 2021-06-07 NOTE — Brief Op Note (Signed)
06/07/2021  12:18 PM  PATIENT:  Marcus Morgan  80 y.o. male  PRE-OPERATIVE DIAGNOSIS:  TRIGGER RIGHT MIDDLE FINGER  POST-OPERATIVE DIAGNOSIS:  TRIGGER RIGHT MIDDLE FINGER  PROCEDURE:  Procedure(s): RELEASE A-1 PULLEY RIGHT MIDDLE FINGER (Right)  SURGEON:  Surgeon(s) and Role:    * Daryll Brod, MD - Primary  PHYSICIAN ASSISTANT:   ASSISTANTS: none   ANESTHESIA:   none  EBL:  4m   BLOOD ADMINISTERED:none  DRAINS: none   LOCAL MEDICATIONS USED:  BUPIVICAINE   SPECIMEN:  No Specimen  DISPOSITION OF SPECIMEN:  N/A  COUNTS:  YES  TOURNIQUET:   Total Tourniquet Time Documented: Forearm (Right) - 18 minutes Total: Forearm (Right) - 18 minutes   DICTATION: .Dragon Dictation  PLAN OF CARE: Discharge to home after PACU  PATIENT DISPOSITION:  PACU - hemodynamically stable.

## 2021-06-07 NOTE — Op Note (Signed)
NAME: Marcus Morgan MEDICAL RECORD NO: [id number] DATE OF BIRTH: 17-Jul-1941 FACILITY: [facility] LOCATION: [facility] PHYSICIAN: Wynonia Sours, MD   OPERATIVE REPORT   DATE OF PROCEDURE: 06/07/21    PREOPERATIVE DIAGNOSIS: Trigger finger right middle finger   POSTOPERATIVE DIAGNOSIS: Same   PROCEDURE: Release A1 pulley right middle   SURGEON: Daryll Brod, M.D.   ASSISTANT: none   ANESTHESIA:  Bier block with sedation and Local   INTRAVENOUS FLUIDS:  Per anesthesia flow sheet.   ESTIMATED BLOOD LOSS:  Minimal.   COMPLICATIONS:  None.   SPECIMENS:  none   TOURNIQUET TIME:    Total Tourniquet Time Documented: Forearm (Right) - 18 minutes Total: Forearm (Right) - 18 minutes    DISPOSITION:  Stable to PACU.   INDICATIONS: Patient is a 80 year old male with a history of trigger fingers.  He has developed triggering of his right middle finger and is elected undergo surgical release.  Pre-.  Postoperative course been discussed along with risks and complications.  He is aware that there is no guarantee to the surgery the possibility of infection recurrence injury to arteries nerves tendons incomplete relief of symptoms and dystrophy.  Preoperative area the patient seen extremity marked by both patient and surgeon antibiotic given  OPERATIVE COURSE: Patient is brought the operating room placed in a supine position with the right arm free.  A forearm IV regional anesthetic was carried out without difficulty under the direction of the anesthesia department.  Prep was done with ChloraPrep and a 3-minute dry time allowed.  An oblique incision was made over the A1 pulley of the right middle finger operative timeout was taken confirm patient procedure.  The dissection was carried down to the A1 pulley.  Retractors were placed protecting neurovascular bundles radially and ulnarly.  The A1 pulley was markedly thickened and released on its radial aspect a small incision made  centrally and A2.  Tenosynovial track proximally was separated.  The 2 tendons were separated breaking any adhesions between the 2 tendons.  The finger was placed through full passive range of motion no further triggering was noted.  The wound was irrigated with saline.  Skin was closed interrupted 4-0 nylon sutures.  Local infiltration quarter percent bupivacaine without epinephrine was given 5 cc was used.  Sterile compressive dressing was applied.  Deflation of the tourniquet all fingers immediately pink.  He was taken to the recovery room for observation in satisfactory condition.  He will be discharged home return to the hand center [facility] in 1 week Tylenol ibuprofen for pain with Ultram for breakthrough.   Daryll Brod, MD Electronically signed, 06/07/21

## 2021-06-07 NOTE — Transfer of Care (Signed)
Immediate Anesthesia Transfer of Care Note  Patient: Marcus Morgan  Procedure(s) Performed: RELEASE A-1 PULLEY RIGHT MIDDLE FINGER (Right)  Patient Location: PACU  Anesthesia Type:MAC and Bier block  Level of Consciousness: awake, alert  and oriented  Airway & Oxygen Therapy: Patient Spontanous Breathing and Patient connected to face mask oxygen  Post-op Assessment: Report given to RN and Post -op Vital signs reviewed and stable  Post vital signs: Reviewed and stable  Last Vitals:  Vitals Value Taken Time  BP 111/72 06/07/21 1217  Temp    Pulse 55 06/07/21 1218  Resp 15 06/07/21 1218  SpO2 99 % 06/07/21 1218  Vitals shown include unvalidated device data.  Last Pain:  Vitals:   06/07/21 1107  TempSrc: Oral  PainSc: 0-No pain         Complications: No notable events documented.

## 2021-06-07 NOTE — H&P (Signed)
  Marcus Morgan is an 80 y.o. male.   Chief Complaint: Catching right middle finger HPI Marcus Morgan is a 21 myo male  with a complaint of triggering of his right middle finger. He was last seen 1 year ago for his left ring finger. He complains of moderate discomfort. He states it began in a month ago. He has no new history of injury. He is not complaining of any numbness or tingling. Nothing makes it better or worse.  He continues to complain of triggering of his right middle finger. He has had a trigger release on his left side in the past.:   Past Medical History:  Diagnosis Date   Frequency of urination    History of kidney stones    Hypertension    Left ureteral stone    Nocturia     Past Surgical History:  Procedure Laterality Date   CYSTOSCOPY/URETEROSCOPY/HOLMIUM LASER/STENT PLACEMENT Left 08/23/2016   Procedure: CYSTOSCOPY  HOLMIUM LASER OF BLADDER  STONE;  Surgeon: Rana Snare, MD;  Location: [facility];  Service: Urology;  Laterality: Left;   EXTRACORPOREAL SHOCK WAVE LITHOTRIPSY  x2  last one 01/23/2011   HOLMIUM LASER APPLICATION N/A [id number]   Procedure: HOLMIUM LASER APPLICATION;  Surgeon: Rana Snare, MD;  Location: [facility];  Service: Urology;  Laterality: N/A;   RIGHT URETEROSCOPIC LASER LITHOTRIPSY STONE EXTRACTION/  STENT PLACEMENT  07/07/2009   TRIGGER FINGER RELEASE Right 12/02/2013   Procedure: MINOR RELEASE A-1 PULLEY RIGHT RING FINGER AND INCIDENTAL DEPUYTREN'S EXCISION;  Surgeon: Cammie Sickle., MD;  Location: [facility];  Service: Orthopedics;  Laterality: Right;    History reviewed. No pertinent family history. Social History:  reports that he quit smoking about 46 years ago. His smoking use included pipe. He has never used smokeless tobacco. He reports that he does not drink alcohol and does not use drugs.  Allergies:  Allergies  Allergen Reactions   Hydrocodone Nausea And Vomiting   Oxycodone Nausea And  Vomiting   Potassium Citrate Other (See Comments)    REACTION WHILE TAKING LOTREL-- NAUSEA AND VOMITING    No medications prior to admission.    No results found for this or any previous visit (from the past 48 hour(s)).  No results found.   Pertinent items are noted in HPI.  Height '5\' 6"'$  (1.676 m), weight 74.8 kg.  General appearance: alert, cooperative, and appears stated age Head: Normocephalic, without obvious abnormality Neck: no JVD Resp: clear to auscultation bilaterally Cardio: regular rate and rhythm, S1, S2 normal, no murmur, click, rub or gallop GI: soft, non-tender; bowel sounds normal; no masses,  no organomegaly Extremities:  Catching right middle finger Pulses: 2+ and symmetric Skin: Skin color, texture, turgor normal. No rashes or lesions Neurologic: Grossly normal Incision/Wound: na  Assessment/Plan Diagnosed stenosing tenosynovitis right middle finger Plan: He has failed conservative treatment at this point time would like to proceed to have this surgically released. Prepare and postoperative course are discussed along with risk and complications. He is aware that there is no guarantee to the surgery the possibility of infection recurrence injury to arteries nerves tendons incomplete relief of symptoms and dystrophy. He is scheduled for release A1 pulley right middle finger as an outpatient under regional anesthesia.  Marcus Morgan 06/07/2021, 10:27 AM

## 2021-06-07 NOTE — Anesthesia Preprocedure Evaluation (Addendum)
Anesthesia Evaluation  Patient identified by MRN, date of birth, ID band Patient awake    Reviewed: Allergy & Precautions, H&P , NPO status , Patient's Chart, lab work & pertinent test results, reviewed documented beta blocker date and time   Airway Mallampati: II  TM Distance: >3 FB Neck ROM: full    Dental no notable dental hx. (+) Teeth Intact, Dental Advisory Given   Pulmonary former smoker,    Pulmonary exam normal breath sounds clear to auscultation       Cardiovascular Exercise Tolerance: Good hypertension, Pt. on medications  Rhythm:regular Rate:Normal     Neuro/Psych negative neurological ROS  negative psych ROS   GI/Hepatic negative GI ROS, Neg liver ROS,   Endo/Other  negative endocrine ROS  Renal/GU negative Renal ROS  negative genitourinary   Musculoskeletal   Abdominal   Peds  Hematology negative hematology ROS (+)   Anesthesia Other Findings   Reproductive/Obstetrics negative OB ROS                            Anesthesia Physical Anesthesia Plan  ASA: 2  Anesthesia Plan: MAC and Bier Block and Bier Block-LIDOCAINE ONLY   Post-op Pain Management:    Induction:   PONV Risk Score and Plan: 1 and Ondansetron  Airway Management Planned: Natural Airway, Nasal Cannula and Simple Face Mask  Additional Equipment: None  Intra-op Plan:   Post-operative Plan:   Informed Consent: I have reviewed the patients History and Physical, chart, labs and discussed the procedure including the risks, benefits and alternatives for the proposed anesthesia with the patient or authorized representative who has indicated his/her understanding and acceptance.     Dental Advisory Given  Plan Discussed with: CRNA and Anesthesiologist  Anesthesia Plan Comments:         Anesthesia Quick Evaluation

## 2021-06-07 NOTE — Anesthesia Postprocedure Evaluation (Signed)
Anesthesia Post Note  Patient: Marcus Morgan  Procedure(s) Performed: RELEASE A-1 PULLEY RIGHT MIDDLE FINGER (Right)     Patient location during evaluation: PACU Anesthesia Type: MAC Level of consciousness: awake and alert Pain management: pain level controlled Vital Signs Assessment: post-procedure vital signs reviewed and stable Respiratory status: spontaneous breathing, nonlabored ventilation, respiratory function stable and patient connected to nasal cannula oxygen Cardiovascular status: stable and blood pressure returned to baseline Postop Assessment: no apparent nausea or vomiting Anesthetic complications: no   No notable events documented.  Last Vitals:  Vitals:   06/07/21 1245 06/07/21 1300  BP: 129/80 (!) 142/71  Pulse: (!) 52 (!) 57  Resp: 16 16  Temp:  36.8 C  SpO2: 96% 96%    Last Pain:  Vitals:   06/07/21 1300  TempSrc:   PainSc: 0-No pain                 Aqil Goetting

## 2021-06-09 ENCOUNTER — Encounter (HOSPITAL_BASED_OUTPATIENT_CLINIC_OR_DEPARTMENT_OTHER): Payer: Self-pay | Admitting: Orthopedic Surgery

## 2021-06-17 DIAGNOSIS — L820 Inflamed seborrheic keratosis: Secondary | ICD-10-CM | POA: Diagnosis not present

## 2021-06-17 DIAGNOSIS — L814 Other melanin hyperpigmentation: Secondary | ICD-10-CM | POA: Diagnosis not present

## 2021-06-17 DIAGNOSIS — D485 Neoplasm of uncertain behavior of skin: Secondary | ICD-10-CM | POA: Diagnosis not present

## 2021-08-11 DIAGNOSIS — L570 Actinic keratosis: Secondary | ICD-10-CM | POA: Diagnosis not present

## 2021-08-11 DIAGNOSIS — L821 Other seborrheic keratosis: Secondary | ICD-10-CM | POA: Diagnosis not present

## 2021-08-12 DIAGNOSIS — N200 Calculus of kidney: Secondary | ICD-10-CM | POA: Diagnosis not present

## 2021-08-12 DIAGNOSIS — N401 Enlarged prostate with lower urinary tract symptoms: Secondary | ICD-10-CM | POA: Diagnosis not present

## 2021-08-12 DIAGNOSIS — R351 Nocturia: Secondary | ICD-10-CM | POA: Diagnosis not present

## 2021-12-21 DIAGNOSIS — N200 Calculus of kidney: Secondary | ICD-10-CM | POA: Diagnosis not present

## 2021-12-21 DIAGNOSIS — N390 Urinary tract infection, site not specified: Secondary | ICD-10-CM | POA: Diagnosis not present

## 2021-12-21 DIAGNOSIS — R109 Unspecified abdominal pain: Secondary | ICD-10-CM | POA: Diagnosis not present

## 2021-12-21 DIAGNOSIS — R319 Hematuria, unspecified: Secondary | ICD-10-CM | POA: Diagnosis not present

## 2022-03-16 DIAGNOSIS — R7303 Prediabetes: Secondary | ICD-10-CM | POA: Diagnosis not present

## 2022-03-16 DIAGNOSIS — I10 Essential (primary) hypertension: Secondary | ICD-10-CM | POA: Diagnosis not present

## 2022-03-16 DIAGNOSIS — E782 Mixed hyperlipidemia: Secondary | ICD-10-CM | POA: Diagnosis not present

## 2022-03-16 DIAGNOSIS — Z0000 Encounter for general adult medical examination without abnormal findings: Secondary | ICD-10-CM | POA: Diagnosis not present

## 2022-03-16 DIAGNOSIS — N401 Enlarged prostate with lower urinary tract symptoms: Secondary | ICD-10-CM | POA: Diagnosis not present

## 2022-03-16 DIAGNOSIS — Z1331 Encounter for screening for depression: Secondary | ICD-10-CM | POA: Diagnosis not present

## 2022-03-31 DIAGNOSIS — H524 Presbyopia: Secondary | ICD-10-CM | POA: Diagnosis not present

## 2022-03-31 DIAGNOSIS — H25813 Combined forms of age-related cataract, bilateral: Secondary | ICD-10-CM | POA: Diagnosis not present

## 2022-03-31 DIAGNOSIS — H0102A Squamous blepharitis right eye, upper and lower eyelids: Secondary | ICD-10-CM | POA: Diagnosis not present

## 2022-03-31 DIAGNOSIS — H40013 Open angle with borderline findings, low risk, bilateral: Secondary | ICD-10-CM | POA: Diagnosis not present

## 2022-07-10 DIAGNOSIS — H579 Unspecified disorder of eye and adnexa: Secondary | ICD-10-CM | POA: Diagnosis not present

## 2022-08-09 DIAGNOSIS — N200 Calculus of kidney: Secondary | ICD-10-CM | POA: Diagnosis not present

## 2022-08-09 DIAGNOSIS — R351 Nocturia: Secondary | ICD-10-CM | POA: Diagnosis not present

## 2022-08-09 DIAGNOSIS — N401 Enlarged prostate with lower urinary tract symptoms: Secondary | ICD-10-CM | POA: Diagnosis not present

## 2022-12-18 DIAGNOSIS — I10 Essential (primary) hypertension: Secondary | ICD-10-CM | POA: Diagnosis not present

## 2022-12-18 DIAGNOSIS — R7303 Prediabetes: Secondary | ICD-10-CM | POA: Diagnosis not present

## 2023-02-08 ENCOUNTER — Encounter (HOSPITAL_COMMUNITY): Payer: Self-pay

## 2023-02-08 ENCOUNTER — Emergency Department (HOSPITAL_COMMUNITY): Payer: Medicare Other

## 2023-02-08 ENCOUNTER — Emergency Department (HOSPITAL_COMMUNITY)
Admission: EM | Admit: 2023-02-08 | Discharge: 2023-02-08 | Disposition: A | Discharge status: Home / Self Care | Payer: Medicare Other | Attending: Emergency Medicine | Admitting: Emergency Medicine

## 2023-02-08 DIAGNOSIS — R1031 Right lower quadrant pain: Secondary | ICD-10-CM | POA: Diagnosis present

## 2023-02-08 DIAGNOSIS — N132 Hydronephrosis with renal and ureteral calculous obstruction: Secondary | ICD-10-CM | POA: Insufficient documentation

## 2023-02-08 DIAGNOSIS — K8020 Calculus of gallbladder without cholecystitis without obstruction: Secondary | ICD-10-CM | POA: Diagnosis not present

## 2023-02-08 DIAGNOSIS — N200 Calculus of kidney: Secondary | ICD-10-CM | POA: Diagnosis not present

## 2023-02-08 DIAGNOSIS — N134 Hydroureter: Secondary | ICD-10-CM | POA: Diagnosis not present

## 2023-02-08 LAB — COMPREHENSIVE METABOLIC PANEL
ALT: 22 U/L (ref 0–44)
AST: 33 U/L (ref 15–41)
Albumin: 3.8 g/dL (ref 3.5–5.0)
Alkaline Phosphatase: 77 U/L (ref 38–126)
Anion gap: 9 (ref 5–15)
BUN: 29 mg/dL — ABNORMAL HIGH (ref 8–23)
CO2: 24 mmol/L (ref 22–32)
Calcium: 9 mg/dL (ref 8.9–10.3)
Chloride: 106 mmol/L (ref 98–111)
Creatinine, Ser: 1.24 mg/dL (ref 0.61–1.24)
GFR, Estimated: 58 mL/min — ABNORMAL LOW (ref >60)
Glucose, Bld: 167 mg/dL — ABNORMAL HIGH (ref 70–99)
Potassium: 3.4 mmol/L — ABNORMAL LOW (ref 3.5–5.1)
Sodium: 139 mmol/L (ref 135–145)
Total Bilirubin: 0.7 mg/dL (ref 0.3–1.2)
Total Protein: 6.6 g/dL (ref 6.5–8.1)

## 2023-02-08 LAB — URINALYSIS, ROUTINE W REFLEX MICROSCOPIC
Bilirubin Urine: NEGATIVE
Glucose, UA: 50 mg/dL — AB
Ketones, ur: 5 mg/dL — AB
Leukocytes,Ua: NEGATIVE
Nitrite: NEGATIVE
Protein, ur: NEGATIVE mg/dL
RBC / HPF: >50 RBC/hpf (ref 0–5)
Specific Gravity, Urine: 1.014 (ref 1.005–1.030)
pH: 7 (ref 5.0–8.0)

## 2023-02-08 LAB — CBC
HCT: 36.6 % — ABNORMAL LOW (ref 39.0–52.0)
Hemoglobin: 12.7 g/dL — ABNORMAL LOW (ref 13.0–17.0)
MCH: 31.6 pg (ref 26.0–34.0)
MCHC: 34.7 g/dL (ref 30.0–36.0)
MCV: 91 fL (ref 80.0–100.0)
Platelets: 186 10*3/uL (ref 150–400)
RBC: 4.02 MIL/uL — ABNORMAL LOW (ref 4.22–5.81)
RDW: 14.2 % (ref 11.5–15.5)
WBC: 8.1 10*3/uL (ref 4.0–10.5)
nRBC: 0 % (ref 0.0–0.2)

## 2023-02-08 LAB — LIPASE, BLOOD: Lipase: 53 U/L — ABNORMAL HIGH (ref 11–51)

## 2023-02-08 MED ORDER — FENTANYL CITRATE PF 50 MCG/ML IJ SOSY
50.0000 ug | PREFILLED_SYRINGE | Freq: Once | INTRAMUSCULAR | Status: AC
  Administered 2023-02-08: 50 ug via INTRAVENOUS
  Filled 2023-02-08: qty 1

## 2023-02-08 MED ORDER — OXYCODONE-ACETAMINOPHEN 5-325 MG PO TABS: 1.0000 | ORAL_TABLET | Freq: Four times a day (QID) | ORAL | 0 refills | Status: AC | PRN

## 2023-02-08 MED ORDER — LACTATED RINGERS IV BOLUS
1000.0000 mL | Freq: Once | INTRAVENOUS | Status: AC
  Administered 2023-02-08: 1000 mL via INTRAVENOUS

## 2023-02-08 MED ORDER — IOHEXOL 350 MG/ML SOLN
75.0000 mL | Freq: Once | INTRAVENOUS | Status: AC | PRN
  Administered 2023-02-08: 75 mL via INTRAVENOUS

## 2023-02-08 MED ORDER — ONDANSETRON 4 MG PO TBDP: ORAL_TABLET | ORAL | 0 refills | Status: AC

## 2023-02-08 MED ORDER — TAMSULOSIN HCL 0.4 MG PO CAPS
0.4000 mg | ORAL_CAPSULE | Freq: Once | ORAL | Status: AC
  Administered 2023-02-08: 0.4 mg via ORAL
  Filled 2023-02-08: qty 1

## 2023-02-08 MED ORDER — ONDANSETRON HCL 4 MG/2ML IJ SOLN
4.0000 mg | Freq: Once | INTRAMUSCULAR | Status: AC
  Administered 2023-02-08: 4 mg via INTRAVENOUS
  Filled 2023-02-08: qty 2

## 2023-02-08 MED ORDER — KETOROLAC TROMETHAMINE 30 MG/ML IJ SOLN
15.0000 mg | Freq: Once | INTRAMUSCULAR | Status: AC
  Administered 2023-02-08: 15 mg via INTRAVENOUS
  Filled 2023-02-08: qty 1

## 2023-02-08 MED ORDER — TAMSULOSIN HCL 0.4 MG PO CAPS: 0.4000 mg | ORAL_CAPSULE | Freq: Every day | ORAL | 0 refills | Status: AC

## 2023-02-08 NOTE — ED Triage Notes (Signed)
Pt states that he has been having RLQ abd pain with n/v/d denies dysuria or fevers/

## 2023-02-09 NOTE — ED Provider Notes (Signed)
[facility]MENT AT [facility] Provider Note   CSN: [id number] Arrival date & time: 02/08/23  0056     History  Chief Complaint  Patient presents with   Abdominal Pain    Marcus Morgan is a 82 y.o. male.  82 year old male with a history of nephrolithiasis presents ER today with right lower quadrant pain.  Patient states that he has passed 38 kidney stones in his life and knows that he has at least 1 more in there to pass.  Sees Dr. Laverle Patter with urology.  Patient states that this pain started earlier today and had some nausea and emesis with it as well.  No fevers.  Normal appetite.  No diarrhea or constipation.  No hematuria like he would normally have.   Abdominal Pain      Home Medications Prior to Admission medications   Medication Sig Start Date End Date Taking? Authorizing Provider  ondansetron (ZOFRAN-ODT) 4 MG disintegrating tablet  ODT q4 hours prn nausea/vomit 02/08/23  Yes Annais Crafts, Barbara Cower, MD  oxyCODONE-acetaminophen (PERCOCET/ROXICET) 5-325 MG tablet Take 1 tablet by mouth every 6 (six) hours as needed for severe pain. 02/08/23  Yes Renell Allum, Barbara Cower, MD  tamsulosin (FLOMAX) 0.4 MG CAPS capsule Take 1 capsule (0.4 mg total) by mouth daily. Until stone passes 02/08/23  Yes Alayza Pieper, Barbara Cower, MD  amLODipine-benazepril (LOTREL) 5-10 MG per capsule Take 1 capsule by mouth every morning.     [provider]  atorvastatin (LIPITOR) 10 MG tablet Take 10 mg by mouth daily.    [provider]  phenazopyridine (PYRIDIUM) 200 MG tablet Take 1 tablet (200 mg total) by mouth 3 (three) times daily as needed for pain. 08/23/16   Barron Alvine, MD  traMADol (ULTRAM) 50 MG tablet Take 1 tablet (50 mg total) by mouth every 6 (six) hours as needed. 06/07/21   Cindee Salt, MD      Allergies    Hydrocodone, Oxycodone, and Potassium citrate    Review of Systems   Review of Systems  Gastrointestinal:  Positive for abdominal pain.    Physical  Exam Updated Vital Signs BP 127/70   Pulse 63   Temp 98.1 F (36.7 C) (Oral)   Resp 16   SpO2 100%  Physical Exam Vitals and nursing note reviewed.  Constitutional:      Appearance: He is well-developed.  HENT:     Head: Normocephalic and atraumatic.  Cardiovascular:     Rate and Rhythm: Normal rate.  Pulmonary:     Effort: Pulmonary effort is normal. No respiratory distress.  Abdominal:     General: There is no distension.     Tenderness: There is abdominal tenderness in the right lower quadrant.  Musculoskeletal:        General: Normal range of motion.     Cervical back: Normal range of motion.  Skin:    General: Skin is warm and dry.  Neurological:     Mental Status: He is alert.     ED Results / Procedures / Treatments   Labs (all labs ordered are listed, but only abnormal results are displayed) Labs Reviewed  LIPASE, BLOOD - Abnormal; Notable for the following components:      Result Value   Lipase 53 (*)    All other components within normal limits  COMPREHENSIVE METABOLIC PANEL - Abnormal; Notable for the following components:   Potassium 3.4 (*)    Glucose, Bld 167 (*)    BUN 29 (*)  GFR, Estimated 58 (*)    All other components within normal limits  CBC - Abnormal; Notable for the following components:   RBC 4.02 (*)    Hemoglobin 12.7 (*)    HCT 36.6 (*)    All other components within normal limits  URINALYSIS, ROUTINE W REFLEX MICROSCOPIC - Abnormal; Notable for the following components:   APPearance HAZY (*)    Glucose, UA 50 (*)    Hgb urine dipstick LARGE (*)    Ketones, ur 5 (*)    Bacteria, UA RARE (*)    All other components within normal limits    EKG None  Radiology CT ABDOMEN PELVIS W CONTRAST  Result Date: 02/08/2023 CLINICAL DATA:  Right flank pain. EXAM: CT ABDOMEN AND PELVIS WITH CONTRAST TECHNIQUE: Multidetector CT imaging of the abdomen and pelvis was performed using the standard protocol following bolus administration of  intravenous contrast. RADIATION DOSE REDUCTION: This exam was performed according to the departmental dose-optimization program which includes automated exposure control, adjustment of the mA and/or kV according to patient size and/or use of iterative reconstruction technique. CONTRAST:  75mL OMNIPAQUE IOHEXOL 350 MG/ML SOLN COMPARISON:  CT without contrast 08/04/2016 FINDINGS: Lower chest: Lung bases show posterior atelectasis without infiltrates and a mild chronically elevated right diaphragm. The cardiac size is normal. Hepatobiliary: There are several scattered hypodensities in the hepatic substance which are too small to characterize but which are unchanged and are probably cysts. In the hepatic dome, there is again noted a 1.5 cm cyst of 18 Hounsfield units, unchanged. There is no hepatic mass enhancement. Single 1 cm stone noted in the gallbladder neck with gallbladder contracted and otherwise unremarkable no biliary dilatation. 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 No intrinsic pancreatic lesion is seen or ductal dilatation. Spleen: There are calcified granulomas.  No splenomegaly.  No mass. Adrenals/Urinary Tract: There is no adrenal mass. No renal cortical mass. There are few scattered punctate nonobstructive caliceal stones in the right kidney and 1 or 2 on the left as well, both primarily the mid to lower pole. On the right, a 5 x 4 x 5 mm stone is noted in the proximal to mid ureter, superimposing just beneath the mid right L4 transverse process. There is moderate upstream hydroureteronephrosis. The right ureter is otherwise clear. There is no left hydroureteronephrosis or ureteral stone. Again noted is an 8 mm bladder stone sitting on top of the left UVJ, but it does not appear to be lodged in the UVJ. It is similar in appearance and position to the previous study in 2017. The enlarged prostate again protrudes into the posterior bladder.  There is mild thickening of the bladder without inflammatory changes, probably due to hypertrophy. Stomach/Bowel: No dilatation or wall thickening, including the appendix. Scattered diverticulosis. No evidence for focal diverticulitis. Vascular/Lymphatic: Aortic atherosclerosis. No enlarged abdominal or pelvic lymph nodes. Reproductive: Marked prosthetic enlargement is similar to the previous exam, maximum diameter 6.5 cm. There are dystrophic calcifications of the left lobe with impression into the posterior bladder. Unremarkable seminal vesicles. Other: Small inguinal fat hernias. No incarcerated hernias. No free fluid, free hemorrhage or free air. Musculoskeletal: Osteopenia and degenerative skeletal changes. No acute or other significant osseous findings. Bridging osteophytes anterior left SI joint. IMPRESSION: 1. 5 x 4 x 5 mm stone in the proximal to mid right ureter with moderate upstream hydroureteronephrosis. 2. Additional nonobstructive micronephrolithiasis. 3. 8 mm bladder stone sitting on top of  the left UVJ, similar in appearance and position to the previous study in 2017. It does not appear lodged in the UVJ. 4. 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 5. Diverticulosis without evidence of diverticulitis. 6. Cholelithiasis without evidence of acute cholecystitis. 7. Aortic atherosclerosis. 8. Small inguinal fat hernias. 9. 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 Aortic Atherosclerosis (ICD10-I70.0). Electronically Signed   By: Almira Bar M.D.   On: 02/08/2023 05:15    Procedures Procedures    Medications Ordered in ED Medications  fentaNYL (SUBLIMAZE) injection 50 mcg (50 mcg Intravenous Given 02/08/23 0328)  lactated ringers bolus 1,000 mL (0 mLs Intravenous Stopped 02/08/23 0440)  ondansetron (ZOFRAN) injection 4 mg (4 mg Intravenous Given 02/08/23 0328)  iohexol (OMNIPAQUE) 350 MG/ML injection 75 mL (75 mLs Intravenous Contrast  Given 02/08/23 0452)  tamsulosin (FLOMAX) capsule 0.4 mg (0.4 mg Oral Given 02/08/23 0625)  ketorolac (TORADOL) 30 MG/ML injection 15 mg (15 mg Intravenous Given 02/08/23 1610)    ED Course/ Medical Decision Making/ A&P                             Medical Decision Making Amount and/or Complexity of Data Reviewed Labs: ordered. Radiology: ordered.  Risk Prescription drug management.   CT scan done to evaluate for possible appendicitis versus cholecystitis as this pain was different than previous kidney stones but this ultimately did just show a kidney stone.  Patient's pain controlled.  Tolerating p.o.  Will follow-up with urology if not passing but states has passed multiple the size before.  I also discussed that he has another kidney stone on the left side that does not seem to be obstructing.  It seems that this may be the 1 it has been there for a while that he is aware of already.  Will follow-up with urology as needed.  Has prostate a megaly which is not new for him Flomax was started for the kidney stone but he states he normally does have any trouble urinating so he can stop that when the kidney stone passes.   Final Clinical Impression(s) / ED Diagnoses Final diagnoses:  Kidney stone    Rx / DC Orders ED Discharge Orders          Ordered    ondansetron (ZOFRAN-ODT) 4 MG disintegrating tablet        02/08/23 0616    tamsulosin (FLOMAX) 0.4 MG CAPS capsule  Daily        02/08/23 0616    oxyCODONE-acetaminophen (PERCOCET/ROXICET) 5-325 MG tablet  Every 6 hours PRN        02/08/23 0616              Amari Burnsworth, Barbara Cower, MD 02/09/23 [phone]

## 2023-03-28 ENCOUNTER — Other Ambulatory Visit: Payer: Self-pay | Admitting: Internal Medicine

## 2023-03-28 DIAGNOSIS — Z87442 Personal history of urinary calculi: Secondary | ICD-10-CM | POA: Diagnosis not present

## 2023-03-28 DIAGNOSIS — Z1331 Encounter for screening for depression: Secondary | ICD-10-CM | POA: Diagnosis not present

## 2023-03-28 DIAGNOSIS — Z79899 Other long term (current) drug therapy: Secondary | ICD-10-CM | POA: Diagnosis not present

## 2023-03-28 DIAGNOSIS — N401 Enlarged prostate with lower urinary tract symptoms: Secondary | ICD-10-CM | POA: Diagnosis not present

## 2023-03-28 DIAGNOSIS — R7303 Prediabetes: Secondary | ICD-10-CM | POA: Diagnosis not present

## 2023-03-28 DIAGNOSIS — N1330 Unspecified hydronephrosis: Secondary | ICD-10-CM | POA: Diagnosis not present

## 2023-03-28 DIAGNOSIS — E782 Mixed hyperlipidemia: Secondary | ICD-10-CM | POA: Diagnosis not present

## 2023-03-28 DIAGNOSIS — N200 Calculus of kidney: Secondary | ICD-10-CM

## 2023-03-28 DIAGNOSIS — Z0000 Encounter for general adult medical examination without abnormal findings: Secondary | ICD-10-CM | POA: Diagnosis not present

## 2023-03-28 DIAGNOSIS — I10 Essential (primary) hypertension: Secondary | ICD-10-CM | POA: Diagnosis not present

## 2023-03-29 ENCOUNTER — Ambulatory Visit
Admission: RE | Admit: 2023-03-29 | Discharge: 2023-03-29 | Disposition: A | Discharge status: Home / Self Care | Payer: Medicare Other | Source: Ambulatory Visit | Attending: Internal Medicine | Admitting: Internal Medicine

## 2023-03-29 DIAGNOSIS — N200 Calculus of kidney: Secondary | ICD-10-CM | POA: Diagnosis not present

## 2023-03-29 DIAGNOSIS — N210 Calculus in bladder: Secondary | ICD-10-CM | POA: Diagnosis not present

## 2023-03-29 DIAGNOSIS — N1330 Unspecified hydronephrosis: Secondary | ICD-10-CM

## 2023-04-09 DIAGNOSIS — D485 Neoplasm of uncertain behavior of skin: Secondary | ICD-10-CM | POA: Diagnosis not present

## 2023-04-09 DIAGNOSIS — D225 Melanocytic nevi of trunk: Secondary | ICD-10-CM | POA: Diagnosis not present

## 2023-04-09 DIAGNOSIS — L821 Other seborrheic keratosis: Secondary | ICD-10-CM | POA: Diagnosis not present

## 2023-04-09 DIAGNOSIS — D2272 Melanocytic nevi of left lower limb, including hip: Secondary | ICD-10-CM | POA: Diagnosis not present

## 2023-04-09 DIAGNOSIS — D692 Other nonthrombocytopenic purpura: Secondary | ICD-10-CM | POA: Diagnosis not present

## 2023-04-09 DIAGNOSIS — D2271 Melanocytic nevi of right lower limb, including hip: Secondary | ICD-10-CM | POA: Diagnosis not present

## 2023-04-09 DIAGNOSIS — L570 Actinic keratosis: Secondary | ICD-10-CM | POA: Diagnosis not present

## 2023-04-09 DIAGNOSIS — D1801 Hemangioma of skin and subcutaneous tissue: Secondary | ICD-10-CM | POA: Diagnosis not present

## 2023-04-24 DIAGNOSIS — Z1329 Encounter for screening for other suspected endocrine disorder: Secondary | ICD-10-CM | POA: Diagnosis not present

## 2023-04-24 DIAGNOSIS — R799 Abnormal finding of blood chemistry, unspecified: Secondary | ICD-10-CM | POA: Diagnosis not present

## 2023-04-24 DIAGNOSIS — Z13220 Encounter for screening for lipoid disorders: Secondary | ICD-10-CM | POA: Diagnosis not present

## 2023-05-01 DIAGNOSIS — I10 Essential (primary) hypertension: Secondary | ICD-10-CM | POA: Diagnosis not present

## 2023-05-01 DIAGNOSIS — R001 Bradycardia, unspecified: Secondary | ICD-10-CM | POA: Diagnosis not present

## 2023-05-01 DIAGNOSIS — Z7689 Persons encountering health services in other specified circumstances: Secondary | ICD-10-CM | POA: Diagnosis not present

## 2023-08-15 DIAGNOSIS — N210 Calculus in bladder: Secondary | ICD-10-CM | POA: Diagnosis not present

## 2023-08-15 DIAGNOSIS — N200 Calculus of kidney: Secondary | ICD-10-CM | POA: Diagnosis not present

## 2024-03-31 DIAGNOSIS — Z1331 Encounter for screening for depression: Secondary | ICD-10-CM | POA: Diagnosis not present

## 2024-03-31 DIAGNOSIS — Z87442 Personal history of urinary calculi: Secondary | ICD-10-CM | POA: Diagnosis not present

## 2024-03-31 DIAGNOSIS — N401 Enlarged prostate with lower urinary tract symptoms: Secondary | ICD-10-CM | POA: Diagnosis not present

## 2024-03-31 DIAGNOSIS — Z0000 Encounter for general adult medical examination without abnormal findings: Secondary | ICD-10-CM | POA: Diagnosis not present

## 2024-03-31 DIAGNOSIS — Z23 Encounter for immunization: Secondary | ICD-10-CM | POA: Diagnosis not present

## 2024-03-31 DIAGNOSIS — R7303 Prediabetes: Secondary | ICD-10-CM | POA: Diagnosis not present

## 2024-03-31 DIAGNOSIS — E782 Mixed hyperlipidemia: Secondary | ICD-10-CM | POA: Diagnosis not present

## 2024-03-31 DIAGNOSIS — I10 Essential (primary) hypertension: Secondary | ICD-10-CM | POA: Diagnosis not present

## 2024-05-21 DIAGNOSIS — Z0000 Encounter for general adult medical examination without abnormal findings: Secondary | ICD-10-CM | POA: Diagnosis not present

## 2024-05-28 DIAGNOSIS — I10 Essential (primary) hypertension: Secondary | ICD-10-CM | POA: Diagnosis not present

## 2024-05-28 DIAGNOSIS — M25512 Pain in left shoulder: Secondary | ICD-10-CM | POA: Diagnosis not present

## 2024-05-28 DIAGNOSIS — D649 Anemia, unspecified: Secondary | ICD-10-CM | POA: Diagnosis not present

## 2024-08-13 DIAGNOSIS — R3912 Poor urinary stream: Secondary | ICD-10-CM | POA: Diagnosis not present

## 2024-08-13 DIAGNOSIS — N400 Enlarged prostate without lower urinary tract symptoms: Secondary | ICD-10-CM | POA: Diagnosis not present

## 2024-08-13 DIAGNOSIS — N401 Enlarged prostate with lower urinary tract symptoms: Secondary | ICD-10-CM | POA: Diagnosis not present

## 2024-08-13 DIAGNOSIS — N200 Calculus of kidney: Secondary | ICD-10-CM | POA: Diagnosis not present

## 2024-09-12 DIAGNOSIS — D649 Anemia, unspecified: Secondary | ICD-10-CM | POA: Diagnosis not present
